# Patient Record
Sex: Male | Born: 1970 | Race: White | Hispanic: No | Smoking: Never smoker
Health system: Southern US, Community
[De-identification: ages and names within clinical notes are randomized; demographics above are authoritative.]

## PROBLEM LIST (undated history)

## (undated) DIAGNOSIS — C801 Malignant (primary) neoplasm, unspecified: Secondary | ICD-10-CM

## (undated) DIAGNOSIS — F419 Anxiety disorder, unspecified: Secondary | ICD-10-CM

## (undated) DIAGNOSIS — N289 Disorder of kidney and ureter, unspecified: Secondary | ICD-10-CM

## (undated) DIAGNOSIS — N200 Calculus of kidney: Secondary | ICD-10-CM

## (undated) DIAGNOSIS — R519 Headache, unspecified: Secondary | ICD-10-CM

## (undated) DIAGNOSIS — C4491 Basal cell carcinoma of skin, unspecified: Secondary | ICD-10-CM

## (undated) DIAGNOSIS — F32A Depression, unspecified: Secondary | ICD-10-CM

## (undated) DIAGNOSIS — Z87442 Personal history of urinary calculi: Secondary | ICD-10-CM

## (undated) HISTORY — DX: Headache, unspecified: R51.9

## (undated) HISTORY — PX: HERNIA REPAIR: SHX51

---

## 1999-07-05 ENCOUNTER — Encounter: Payer: Self-pay | Admitting: Emergency Medicine

## 1999-07-05 ENCOUNTER — Emergency Department (HOSPITAL_COMMUNITY): Admission: EM | Admit: 1999-07-05 | Discharge: 1999-07-05 | Payer: Self-pay | Admitting: Emergency Medicine

## 2001-05-16 ENCOUNTER — Emergency Department (HOSPITAL_COMMUNITY): Admission: EM | Admit: 2001-05-16 | Discharge: 2001-05-16 | Payer: Self-pay | Admitting: Emergency Medicine

## 2004-06-16 ENCOUNTER — Emergency Department (HOSPITAL_COMMUNITY): Admission: EM | Admit: 2004-06-16 | Discharge: 2004-06-16 | Payer: Self-pay | Admitting: Emergency Medicine

## 2004-10-15 ENCOUNTER — Emergency Department: Payer: Self-pay | Admitting: Emergency Medicine

## 2005-02-11 ENCOUNTER — Inpatient Hospital Stay: Payer: Self-pay | Admitting: General Surgery

## 2005-02-11 ENCOUNTER — Other Ambulatory Visit: Payer: Self-pay

## 2005-03-25 IMAGING — CR DG FINGER THUMB 2+V*R*
1 series · 1 of 1 positions shown · non-contrast
Comparison: none

CLINICAL DATA: Injury to right thumb.  Pain.
 RIGHT THUMB 3 VIEWS
 There is no evidence of fracture or dislocation. No other significant bone or soft tissue abnormalities are seen.

 IMPRESSION
 Normal study.

[view not recorded]
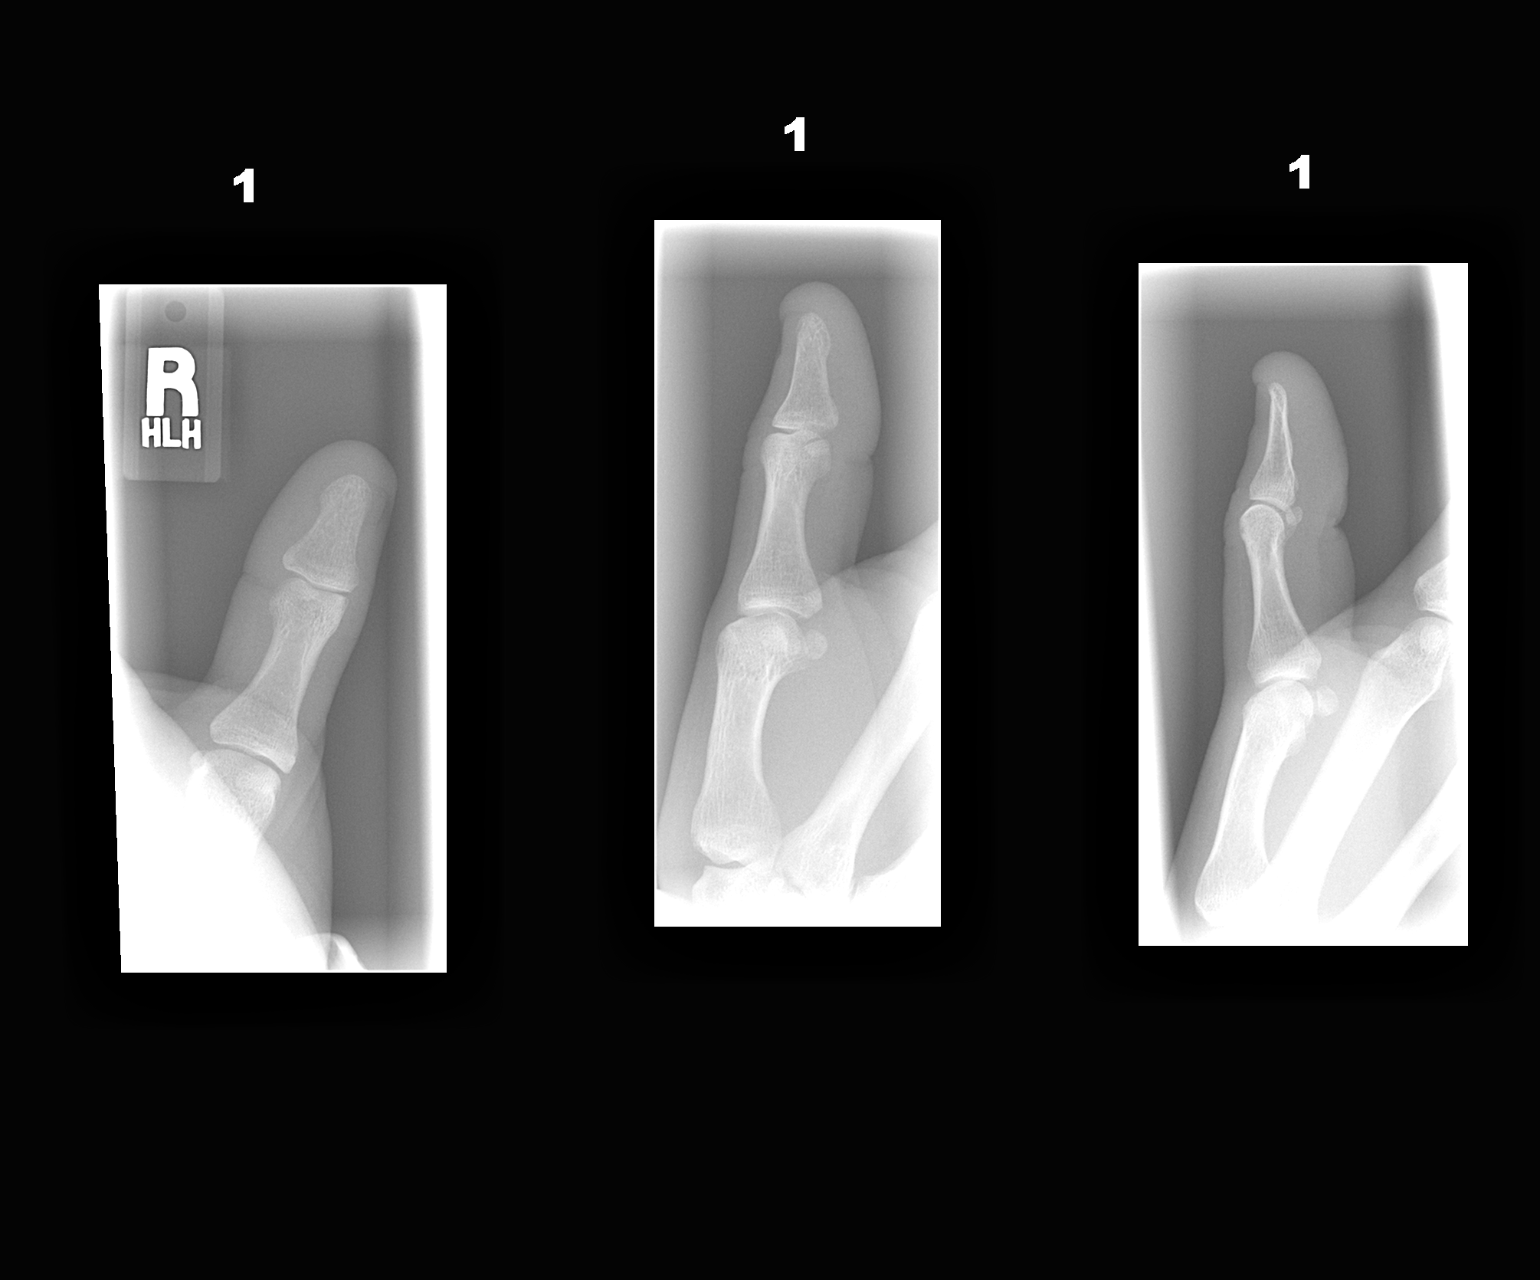

[1 of 1 positions shown; findings below may reference images not displayed]

## 2006-11-14 ENCOUNTER — Emergency Department: Payer: Self-pay | Admitting: Emergency Medicine

## 2007-08-23 IMAGING — CT CT ABD-PELV W/O CM
1 of 2 series · 15 of 32 positions shown, 19 images · non-contrast
Comparison: none

REASON FOR EXAM: ABDOMINAL/FLANK PAIN, HISTORY OF KIDNEY STONES
COMMENTS:

PROCEDURE:     CT  - CT ABDOMEN AND PELVIS W[DATE] [DATE]
RESULT:     Unenhanced, emergent CT scan of abdomen and pelvis is performed
for LEFT flank pain.

[Series 2: soft tissue · axial · 0.69mm/px · z∈[-1484,-1070]mm · 15 of 156 slices shown, 19 images]
[im 12/156  soft-tissue]
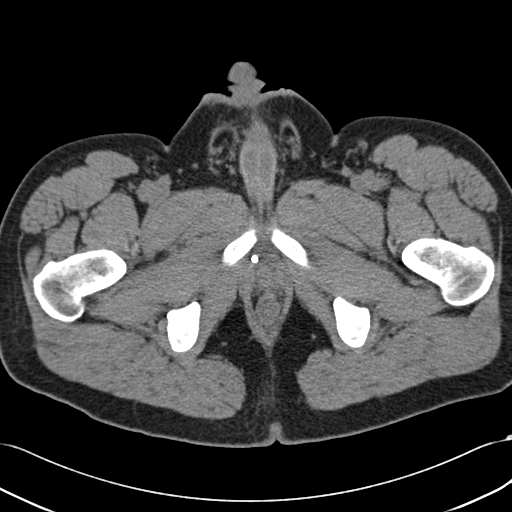
[im 12/156  bone]
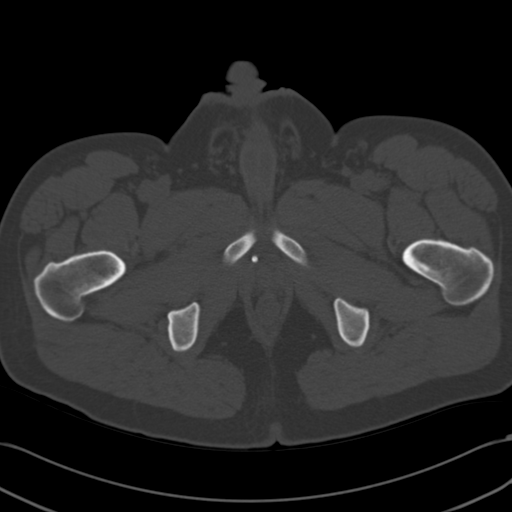
[im 23/156  soft-tissue]
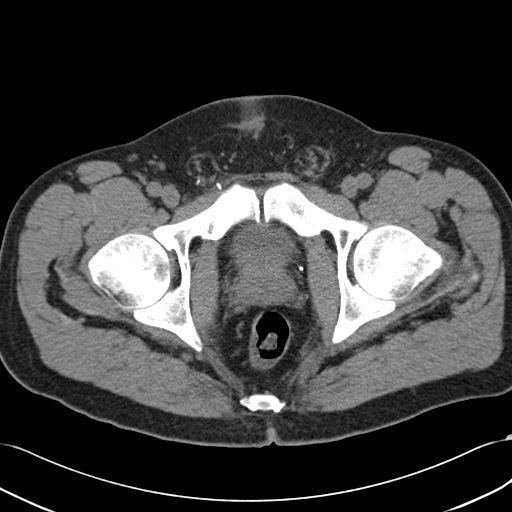
[im 35/156  soft-tissue]
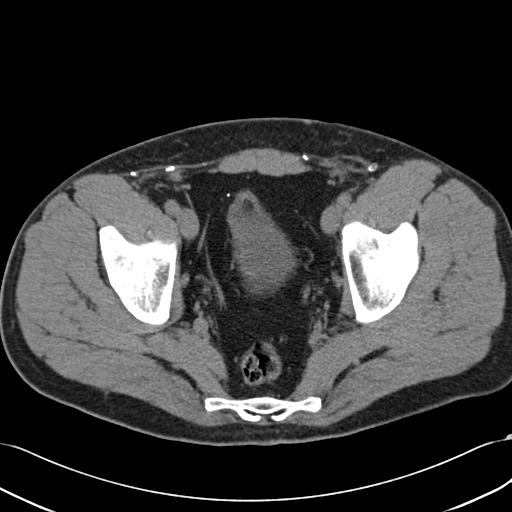
[im 46/156  soft-tissue]
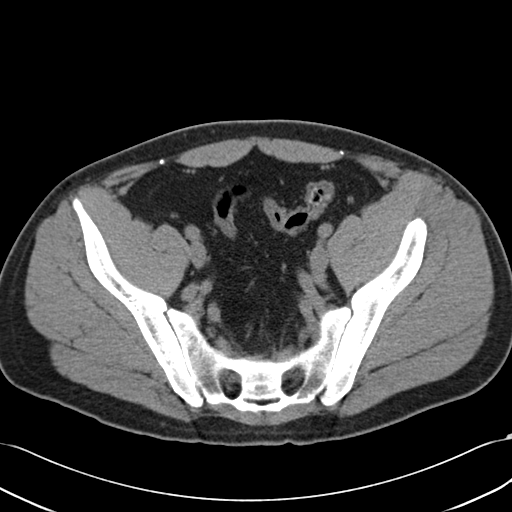
[im 58/156  soft-tissue]
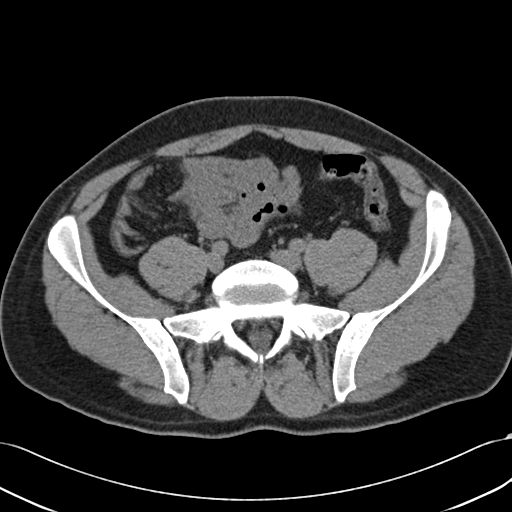
[im 69/156  soft-tissue]
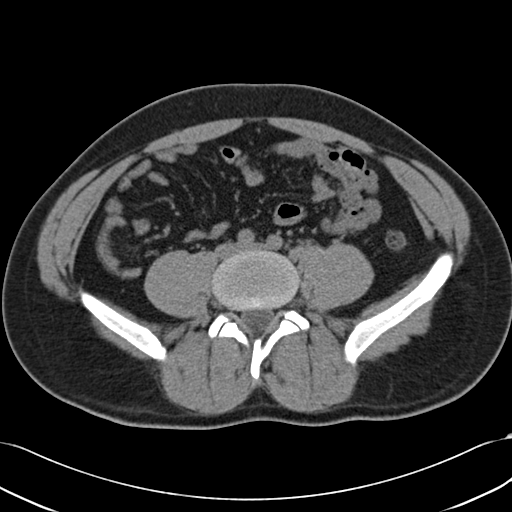
[im 81/156  soft-tissue]
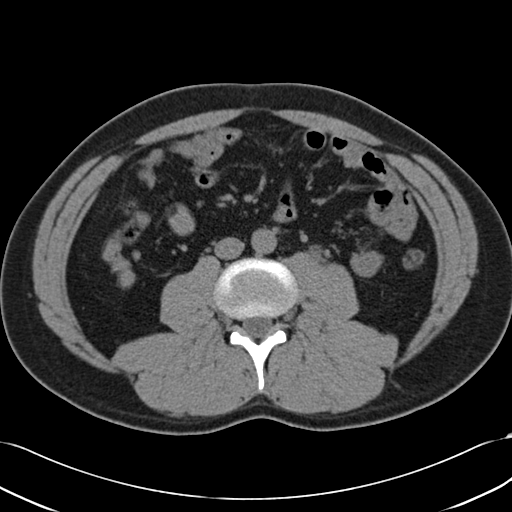
[im 92/156  soft-tissue]
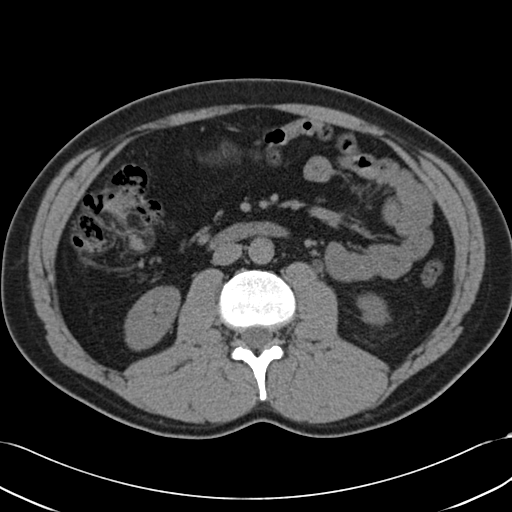
[im 104/156  soft-tissue]
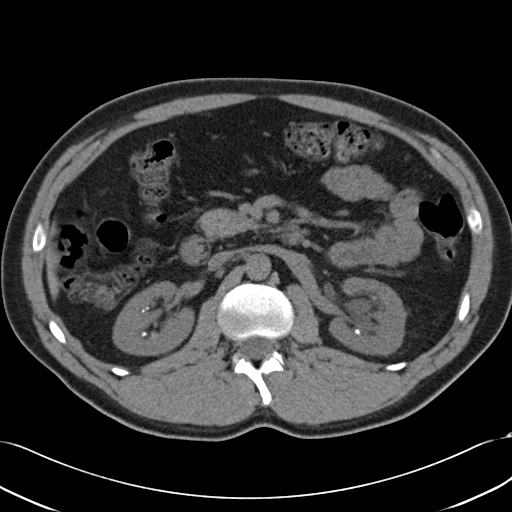
[im 104/156  bone]
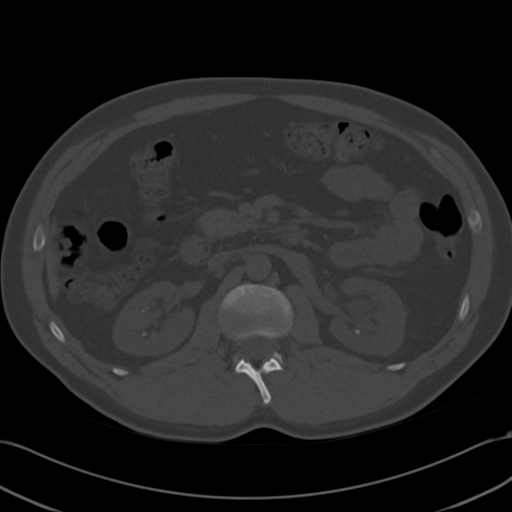
[im 115/156  soft-tissue]
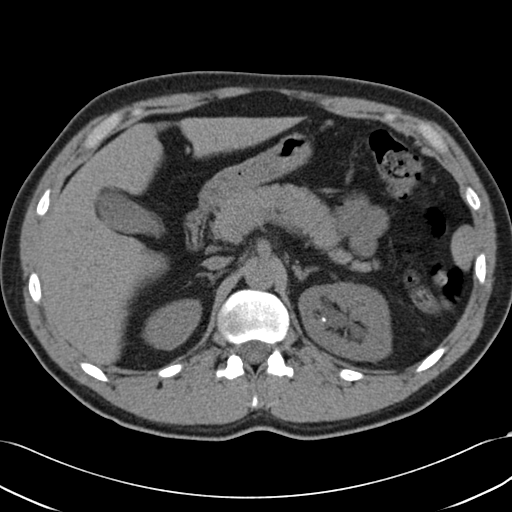
[im 127/156  soft-tissue]
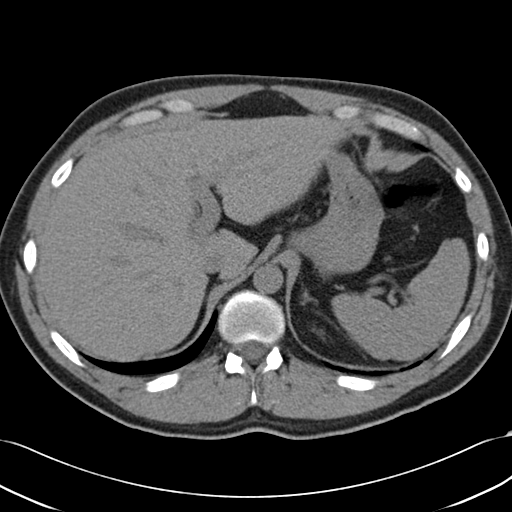
[im 133/156  lung]
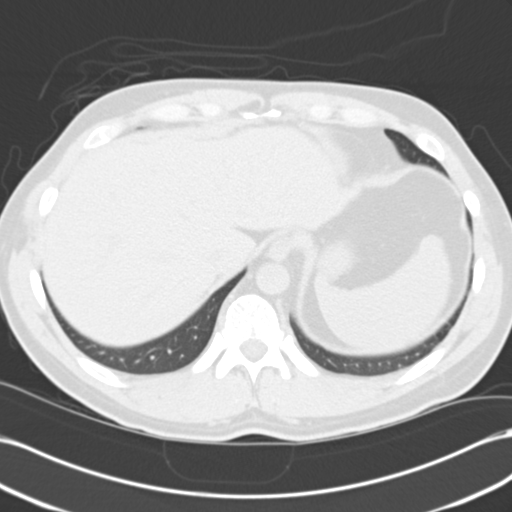
[im 138/156  soft-tissue]
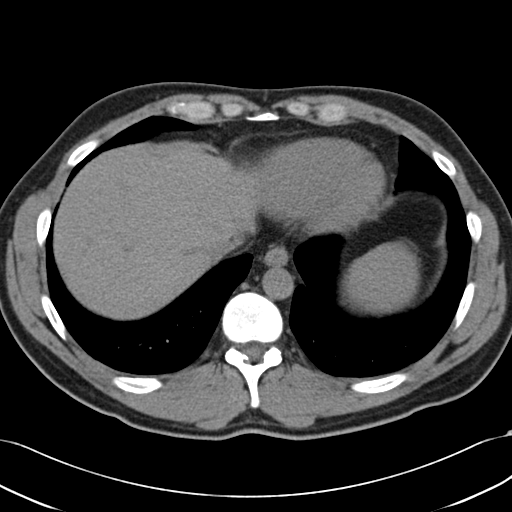
[im 138/156  lung]
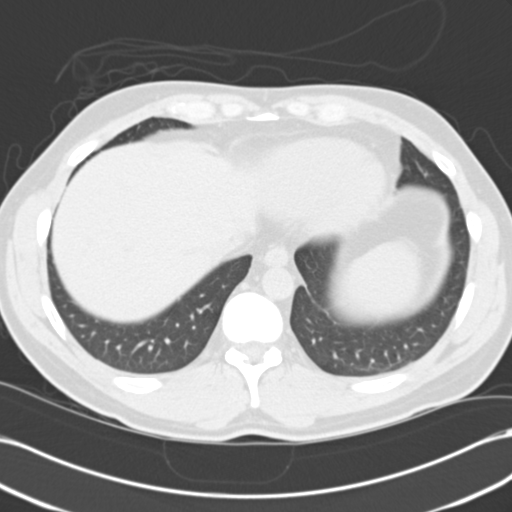
[im 144/156  lung]
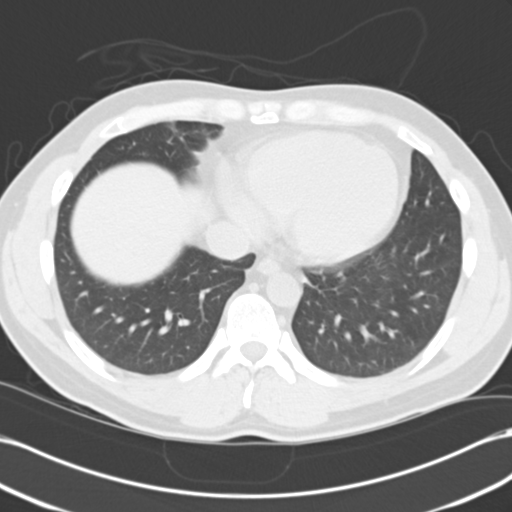
[im 150/156  soft-tissue]
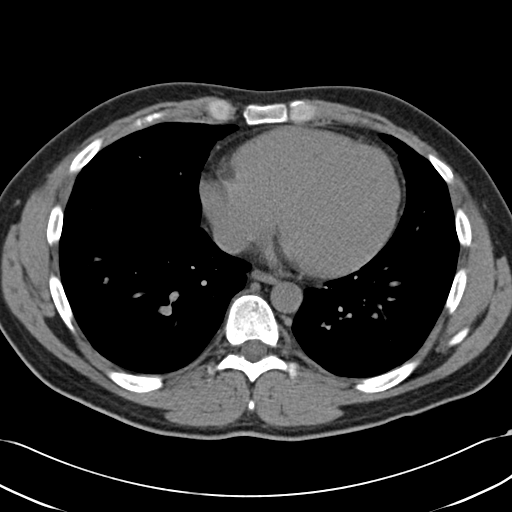
[im 150/156  lung]
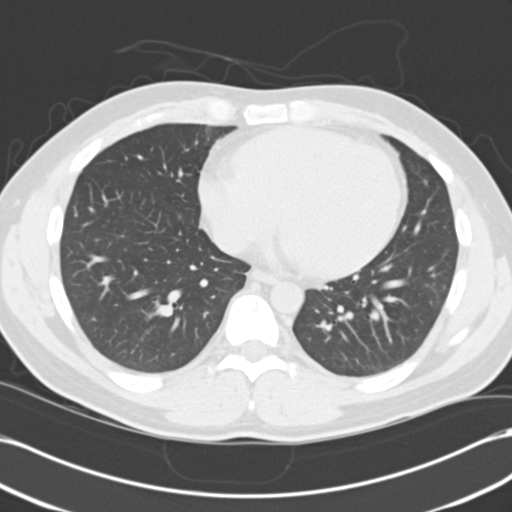

[15 of 32 positions shown; findings below may reference images not displayed]

FINDINGS: There is an approximately 5.0 mm to 6.0 mm calculus within the
proximal LEFT ureter at the level of the iliac crest. There is noted mild to
moderate hydronephrosis on the LEFT. There are multiple calculi in both
kidneys ranging from 2.0 to 5.0 mm. No hydronephrosis is noted on the RIGHT.
Multiple pelvic phleboliths are identified. There is no ascites,
appendicitis or diverticulitis identified. No major organ abnormalities are
noted on this noncontrast study.

The images through the lung bases reveal the lung bases to be clear with no
effusions.
IMPRESSION: 1.     Bilateral renal calculi.
2.     There is noted a 5.0 to 6.0 mm calculus in the mid LEFT ureter with
proximal mild to moderate hydronephrosis.

The report was called to the Emergency Room at the conclusion of dictation.

## 2008-05-19 ENCOUNTER — Emergency Department: Payer: Self-pay | Admitting: Emergency Medicine

## 2009-02-25 IMAGING — CR DG CHEST 2V
1 series · 2 of 2 positions shown · non-contrast
Comparison: none

REASON FOR EXAM: cough
COMMENTS:

PROCEDURE:     DXR - DXR CHEST PA (OR AP) AND LATERAL  - May 19, 2008  [DATE]
RESULT:     The lung fields are clear. The heart, mediastinal and osseous
structures show no acute changes.

[Series 1: view not recorded · 0.17mm/px · 2 of 2 slices shown]
[im 1/2]
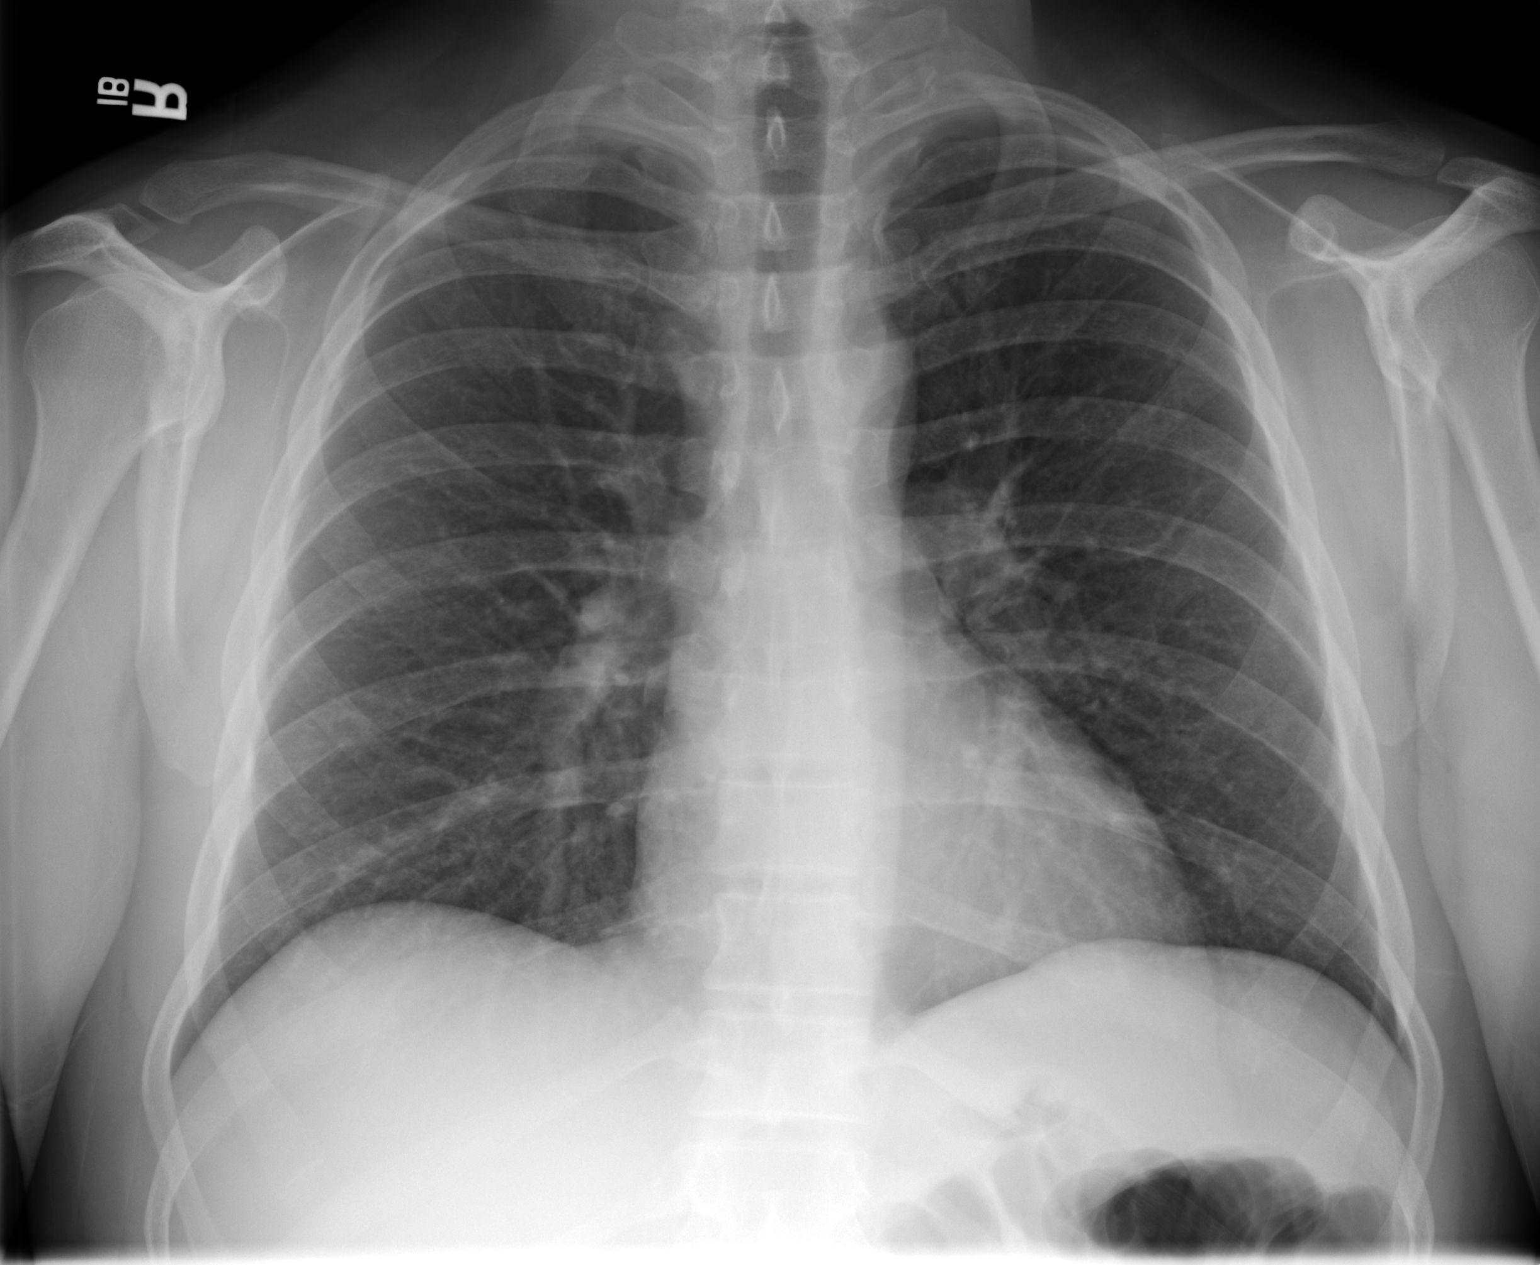
[im 2/2]
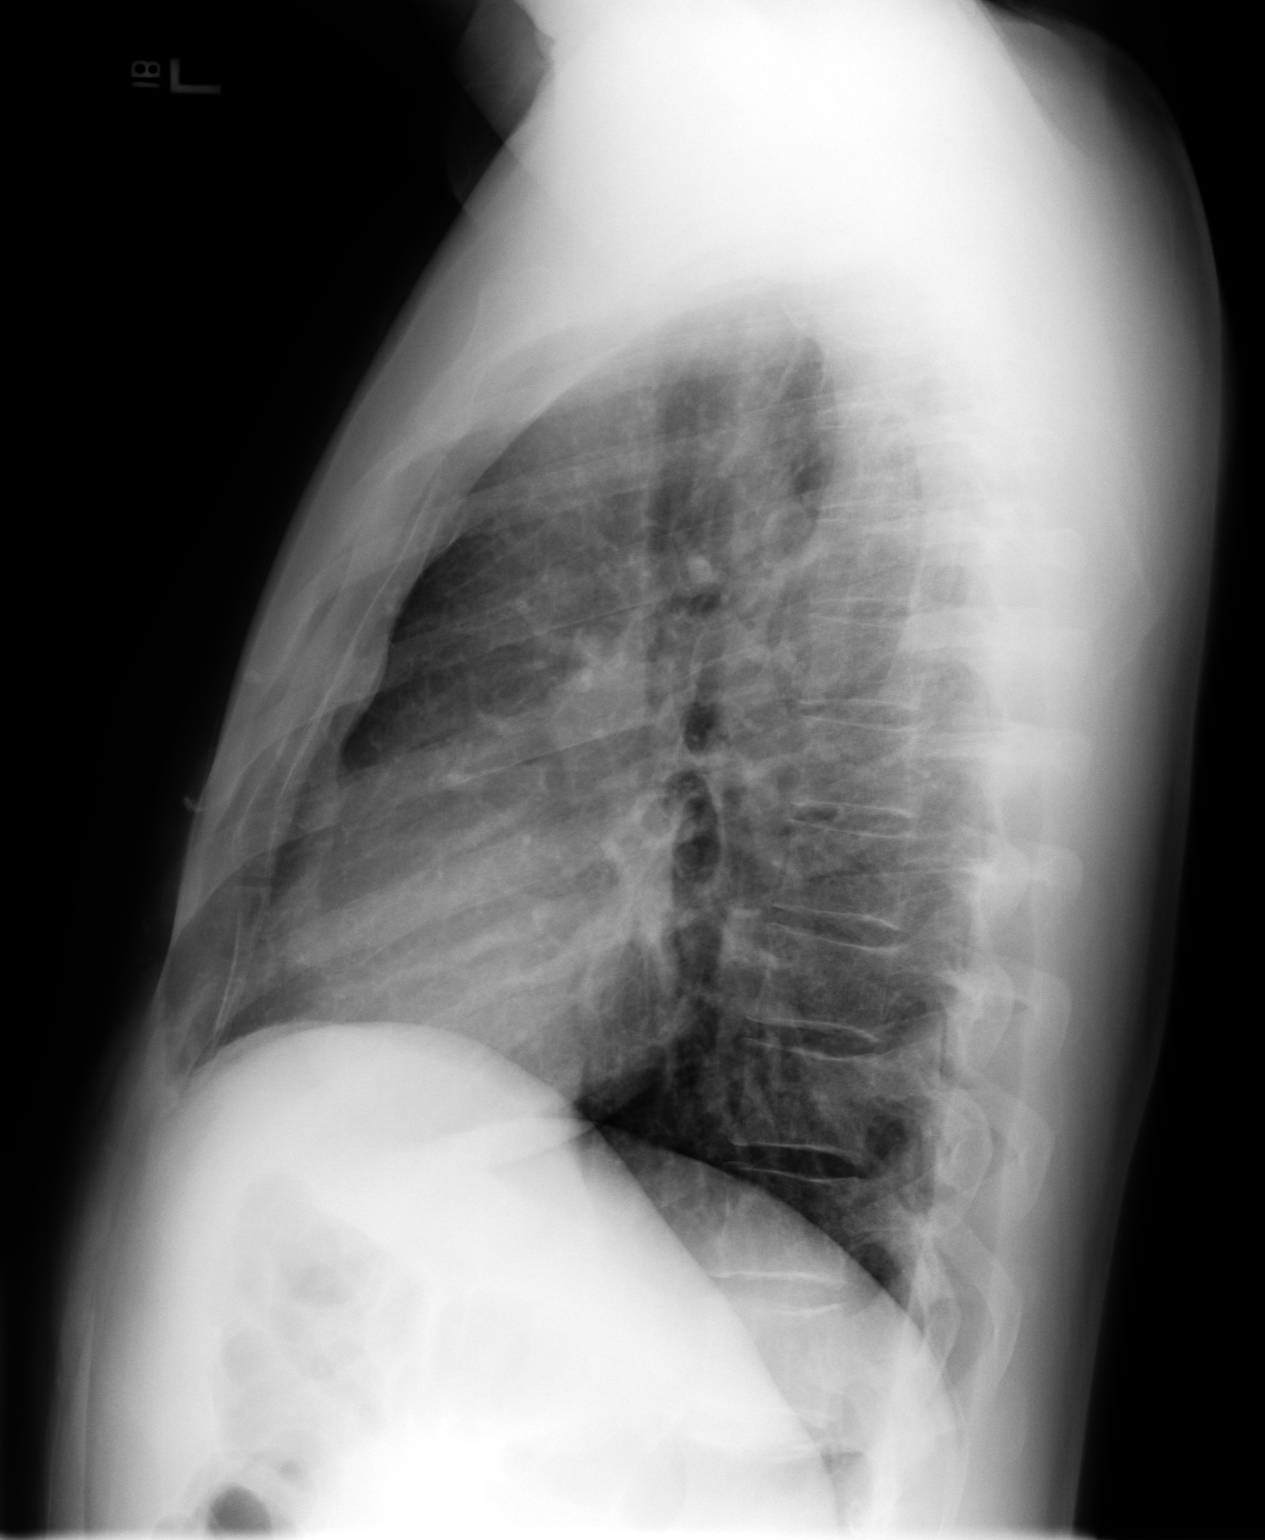

[2 of 2 positions shown; findings below may reference images not displayed]

IMPRESSION: 1.     No acute changes are identified.

## 2009-04-29 ENCOUNTER — Emergency Department: Payer: Self-pay | Admitting: Internal Medicine

## 2011-03-18 ENCOUNTER — Inpatient Hospital Stay (INDEPENDENT_AMBULATORY_CARE_PROVIDER_SITE_OTHER)
Admission: RE | Admit: 2011-03-18 | Discharge: 2011-03-18 | Disposition: A | Discharge status: Home / Self Care | Payer: BC Managed Care – PPO | Source: Ambulatory Visit | Attending: Family Medicine | Admitting: Family Medicine

## 2011-03-18 DIAGNOSIS — M25529 Pain in unspecified elbow: Secondary | ICD-10-CM

## 2011-03-18 DIAGNOSIS — J029 Acute pharyngitis, unspecified: Secondary | ICD-10-CM

## 2011-03-18 DIAGNOSIS — M799 Soft tissue disorder, unspecified: Secondary | ICD-10-CM

## 2011-03-18 LAB — POCT RAPID STREP A (OFFICE): Streptococcus, Group A Screen (Direct): NEGATIVE

## 2011-05-17 ENCOUNTER — Inpatient Hospital Stay (INDEPENDENT_AMBULATORY_CARE_PROVIDER_SITE_OTHER)
Admission: RE | Admit: 2011-05-17 | Discharge: 2011-05-17 | Disposition: A | Discharge status: Home / Self Care | Payer: BC Managed Care – PPO | Source: Ambulatory Visit | Attending: Family Medicine | Admitting: Family Medicine

## 2011-05-17 DIAGNOSIS — L559 Sunburn, unspecified: Secondary | ICD-10-CM

## 2013-05-04 ENCOUNTER — Emergency Department: Payer: Self-pay | Admitting: Internal Medicine

## 2013-05-04 LAB — COMPREHENSIVE METABOLIC PANEL
Albumin: 4.1 g/dL (ref 3.4–5.0)
Alkaline Phosphatase: 78 U/L (ref 50–136)
Anion Gap: 5 — ABNORMAL LOW (ref 7–16)
BUN: 12 mg/dL (ref 7–18)
Bilirubin,Total: 0.5 mg/dL (ref 0.2–1.0)
Calcium, Total: 8.8 mg/dL (ref 8.5–10.1)
Chloride: 106 mmol/L (ref 98–107)
Co2: 27 mmol/L (ref 21–32)
Creatinine: 1.33 mg/dL — ABNORMAL HIGH (ref 0.60–1.30)
EGFR (African American): >60
EGFR (Non-African Amer.): >60
Glucose: 119 mg/dL — ABNORMAL HIGH (ref 65–99)
Osmolality: 277 (ref 275–301)
Potassium: 4 mmol/L (ref 3.5–5.1)
SGOT(AST): 20 U/L (ref 15–37)
SGPT (ALT): 22 U/L (ref 12–78)
Sodium: 138 mmol/L (ref 136–145)
Total Protein: 7.7 g/dL (ref 6.4–8.2)

## 2013-05-04 LAB — URINALYSIS, COMPLETE
Bacteria: NONE SEEN
Bilirubin,UR: NEGATIVE
Glucose,UR: NEGATIVE mg/dL (ref 0–75)
Ketone: NEGATIVE
Leukocyte Esterase: NEGATIVE
Nitrite: NEGATIVE
Ph: 6 (ref 4.5–8.0)
Protein: 100
RBC,UR: 2606 /HPF (ref 0–5)
Specific Gravity: 1.02 (ref 1.003–1.030)
Squamous Epithelial: NONE SEEN
WBC UR: 2 /HPF (ref 0–5)

## 2013-05-04 LAB — CBC
HCT: 45.2 % (ref 40.0–52.0)
HGB: 15.2 g/dL (ref 13.0–18.0)
MCH: 29.2 pg (ref 26.0–34.0)
MCHC: 33.6 g/dL (ref 32.0–36.0)
MCV: 87 fL (ref 80–100)
Platelet: 219 10*3/uL (ref 150–440)
RBC: 5.2 10*6/uL (ref 4.40–5.90)
RDW: 13.8 % (ref 11.5–14.5)
WBC: 14.1 10*3/uL — ABNORMAL HIGH (ref 3.8–10.6)

## 2013-11-18 ENCOUNTER — Emergency Department: Payer: Self-pay | Admitting: Emergency Medicine

## 2014-02-10 IMAGING — CT CT STONE STUDY
2 series · 12 of 16 positions shown, 15 images · non-contrast
Comparison: none

REASON FOR EXAM: flank pain r
COMMENTS:

[Series 2: soft tissue · axial · 0.74mm/px · z∈[-1470,-1080]mm · 10 of 160 slices shown, 13 images]
[im 15/160  soft-tissue]
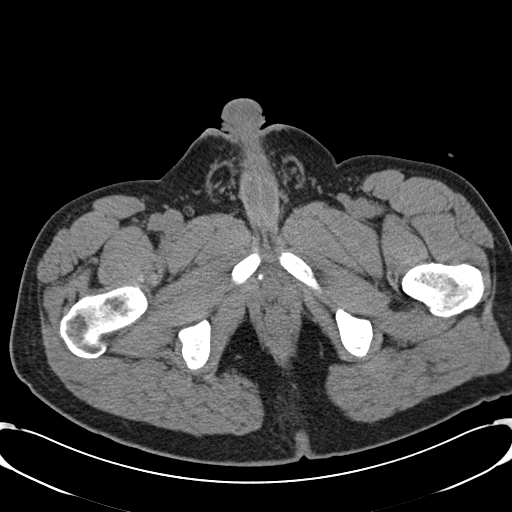
[im 15/160  bone]
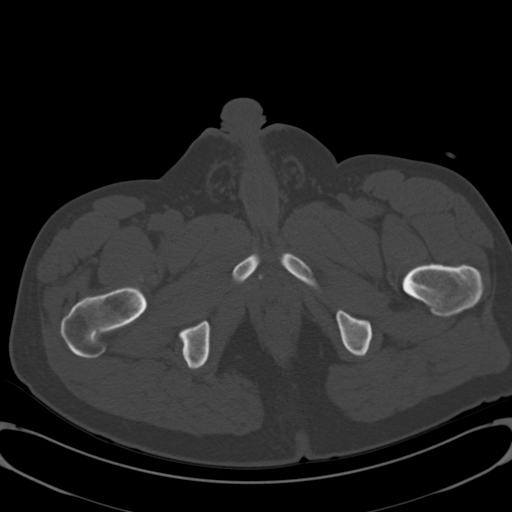
[im 29/160  soft-tissue]
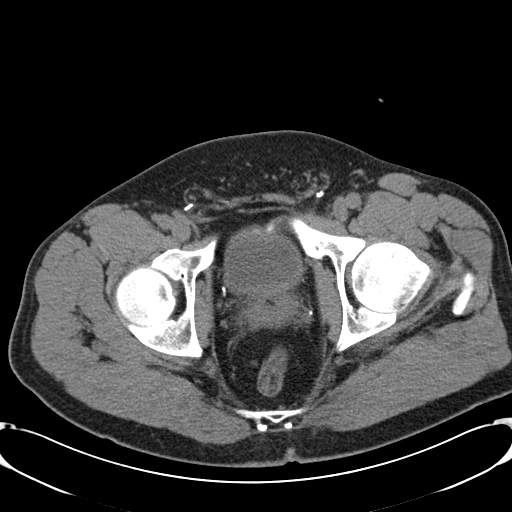
[im 44/160  soft-tissue]
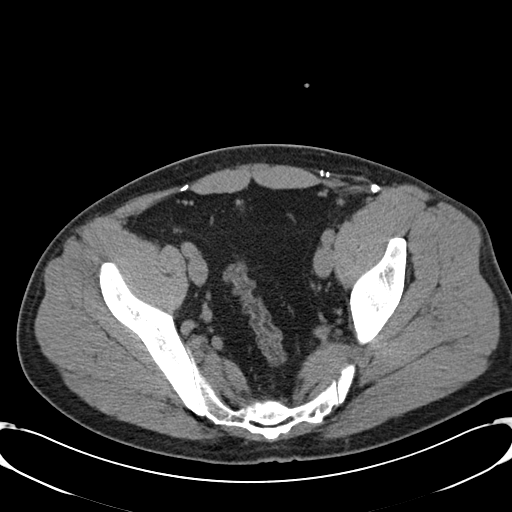
[im 58/160  soft-tissue]
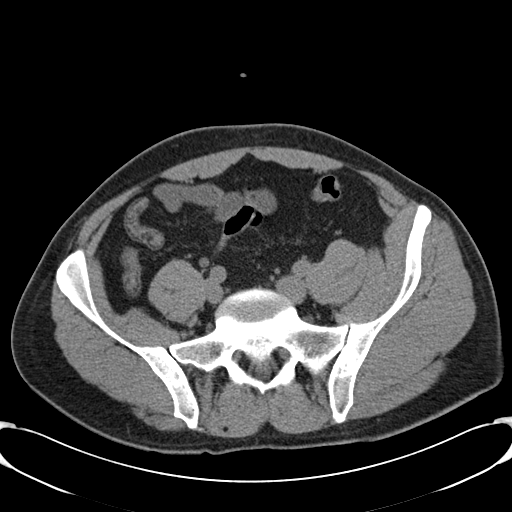
[im 73/160  soft-tissue]
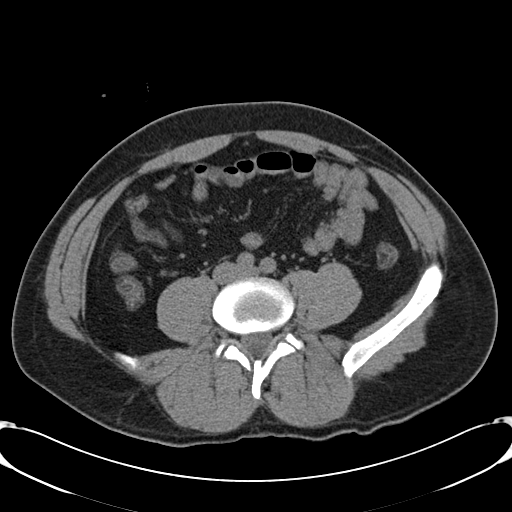
[im 73/160  bone]
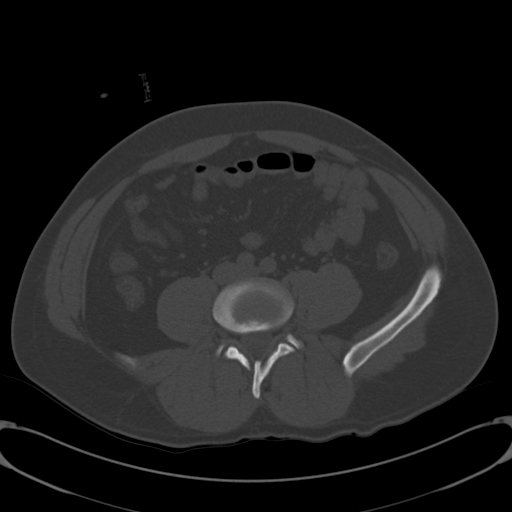
[im 87/160  soft-tissue]
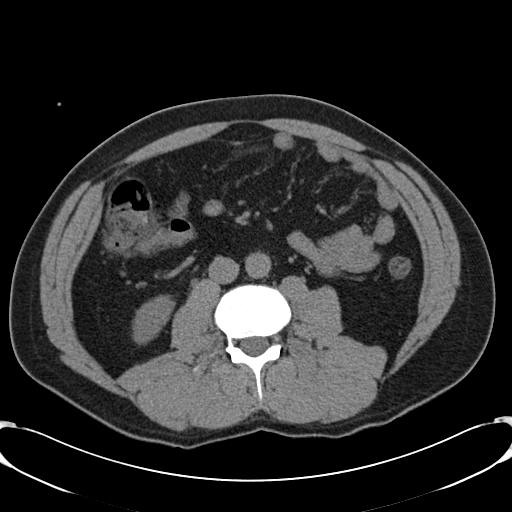
[im 102/160  soft-tissue]
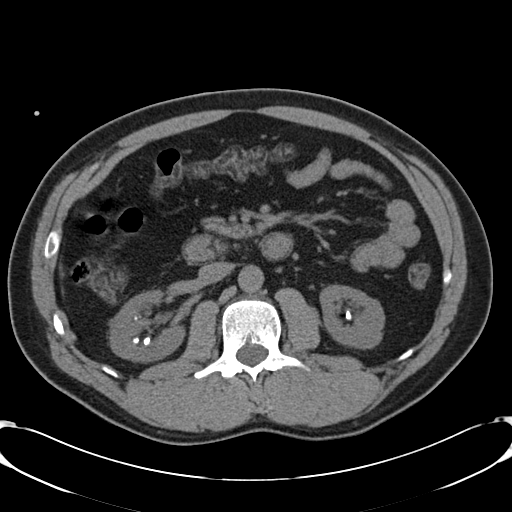
[im 116/160  soft-tissue]
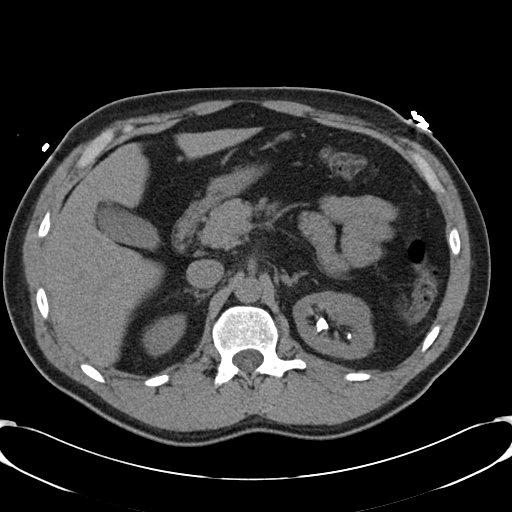
[im 131/160  soft-tissue]
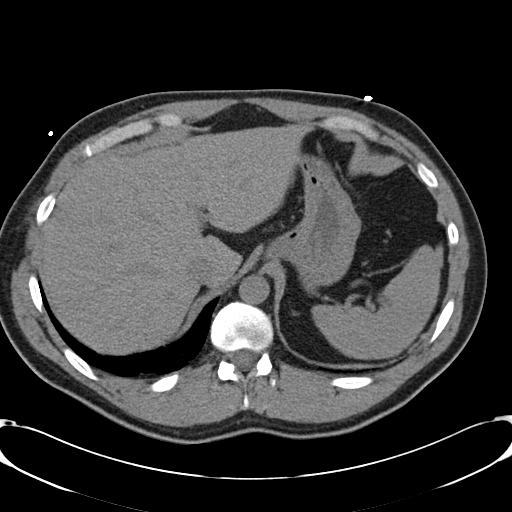
[im 131/160  bone]
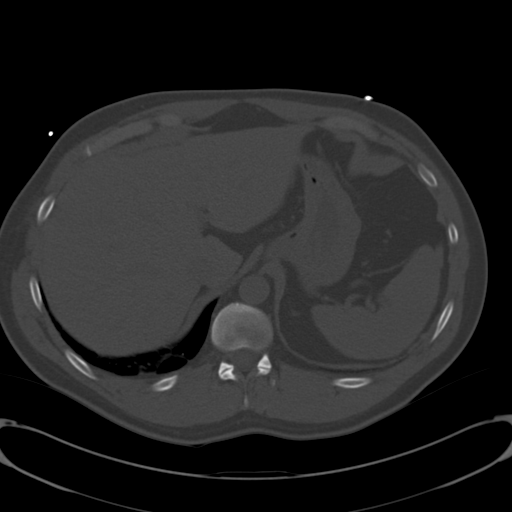
[im 145/160  soft-tissue]
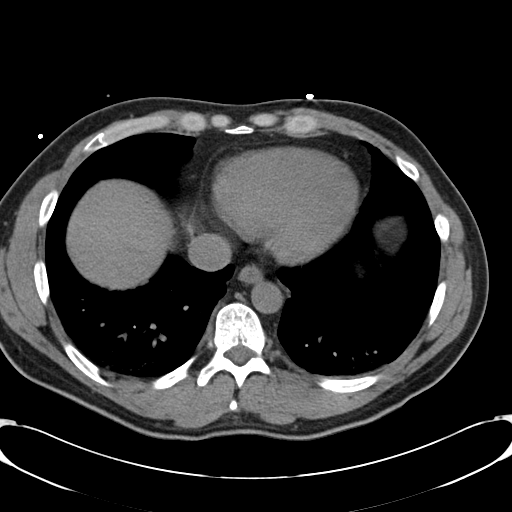

[Series 4: lung · axial · 0.74mm/px · z∈[-1136,-1086]mm · 2 of 52 slices shown]
[im 18/52  bone]
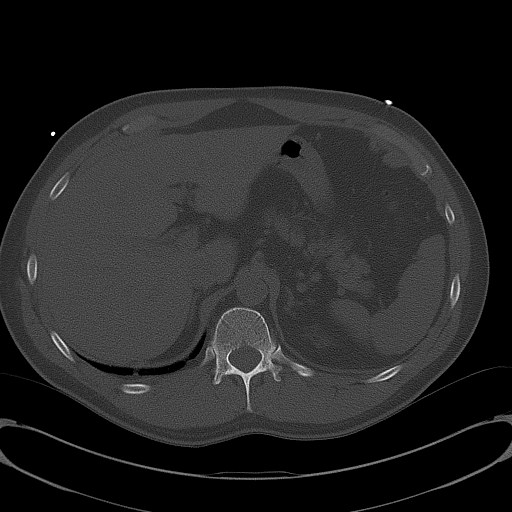
[im 35/52  bone]
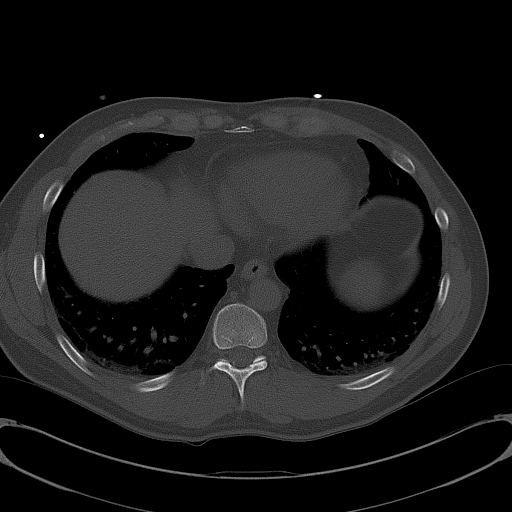

[12 of 16 positions shown; findings below may reference images not displayed]

PROCEDURE:     CT  - CT ABDOMEN /PELVIS WO (STONE)  - May 04, 2013  [DATE]

RESULT:     Axial noncontrast CT scanning was performed through the abdomen
and pelvis with reconstructions at 3 mm intervals and slice thicknesses.

There are multiple stones in both kidneys. There is no hydronephrosis. There
is a 2 mm diameter stone in the proximal right ureter demonstrated on image
83. The partially distended urinary bladder is normal in appearance. There
are numerous phleboliths within the pelvis.

The liver exhibits no focal mass or ductal dilation. The gallbladder,
pancreas, spleen, nondistended stomach, and adrenal glands are normal in
appearance. The caliber of the abdominal aorta is normal. There is no
periaortic nor pericaval lymphadenopathy. The unopacified loops of small and
large bowel exhibit no evidence of ileus nor of obstruction. There are
scattered sigmoid diverticula without evidence of acute diverticulitis. The
appendix is not discretely demonstrated.

The patient has undergone previous bilateral inguinal hernia repair. Some
breakdown at the repair site is suspected.

The lung bases exhibit compressive atelectasis bilaterally. The cardiac
chambers are top normal to mildly enlarged. The lumbar vertebral bodies are
preserved in height.
IMPRESSION: 1. There is a 2 mm diameter proximal right ureteral stone. This is resulting
in very minimal hydronephrosis. There are multiple stones in both kidneys.
There is no evidence of obstruction on the left. No bladder stone is
demonstrated.
2. There is no acute hepatobiliary abnormality nor acute bowel abnormality.
3. There is very mild compressive atelectasis posteriorly in both lung
bases.

[REDACTED]

## 2014-08-27 IMAGING — CR DG ELBOW COMPLETE 3+V*L*
1 series · 4 of 4 positions shown · non-contrast
Comparison: None.

CLINICAL DATA: Pain and swelling

EXAM:
LEFT ELBOW - COMPLETE 3+ VIEW

[Series 1: ap · 0.17mm/px · 4 of 4 slices shown]
[im 1/4]
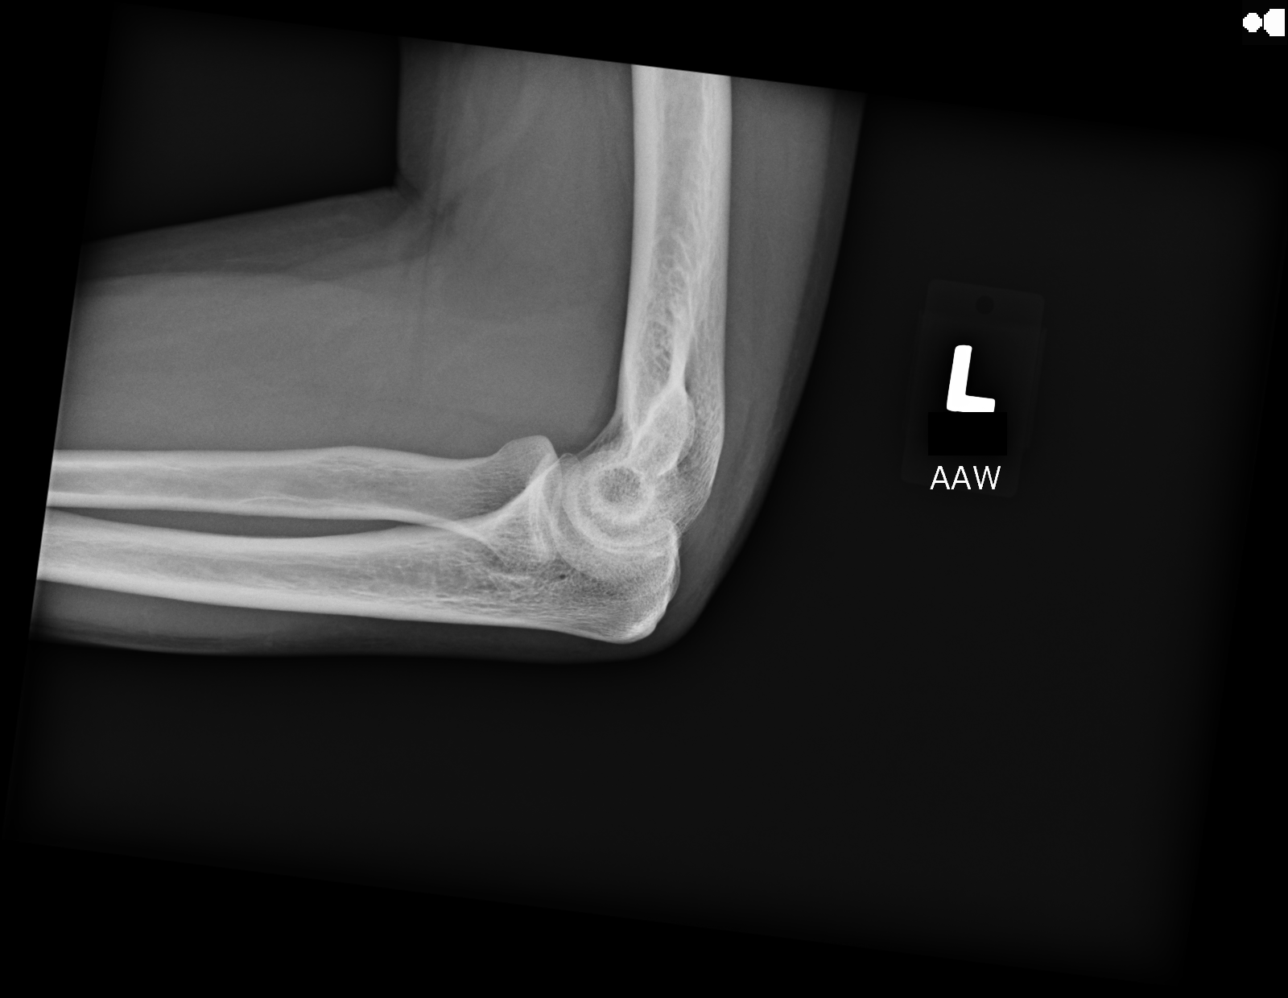
[im 2/4]
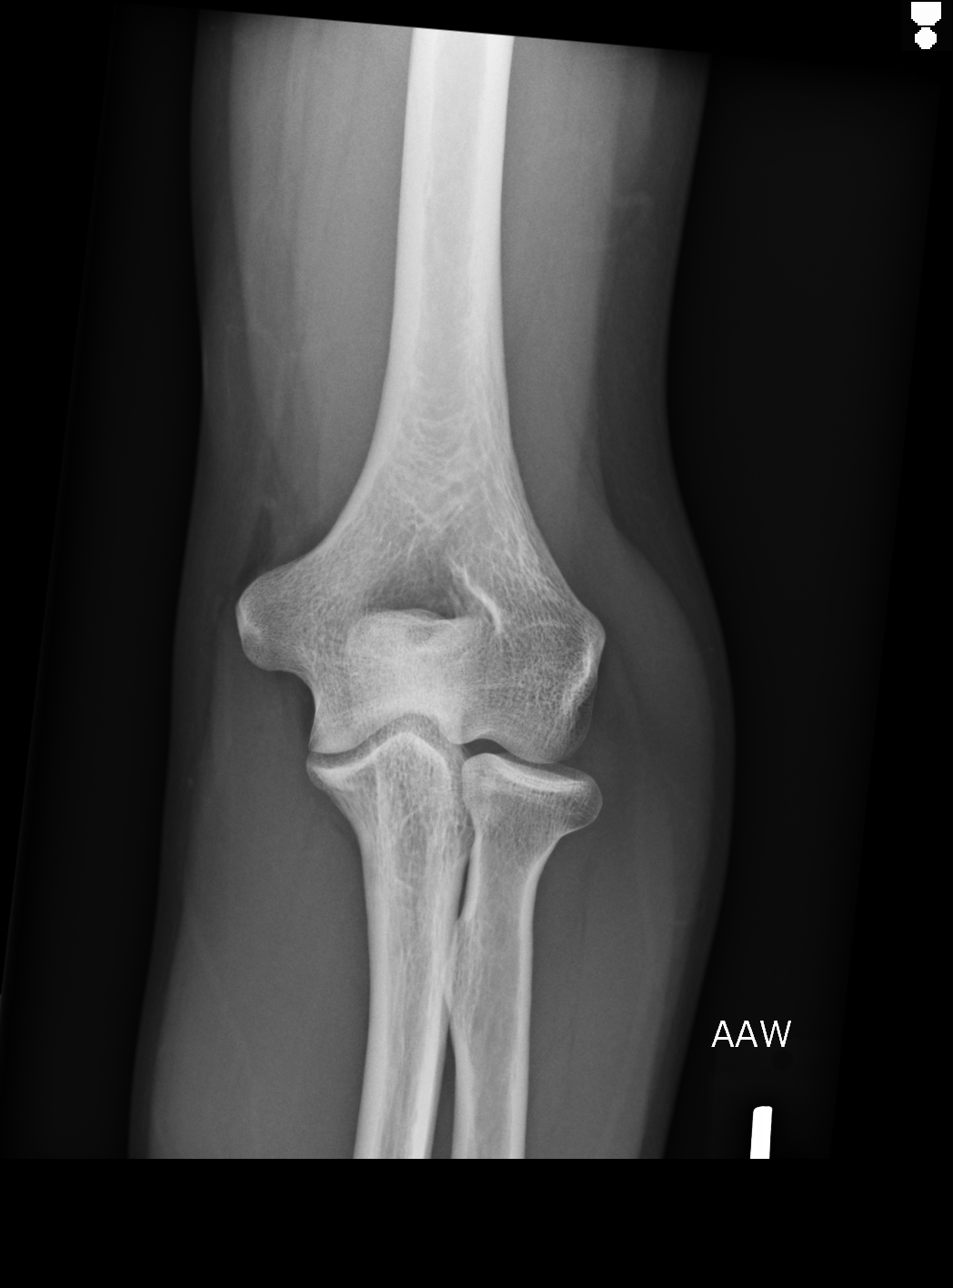
[im 3/4]
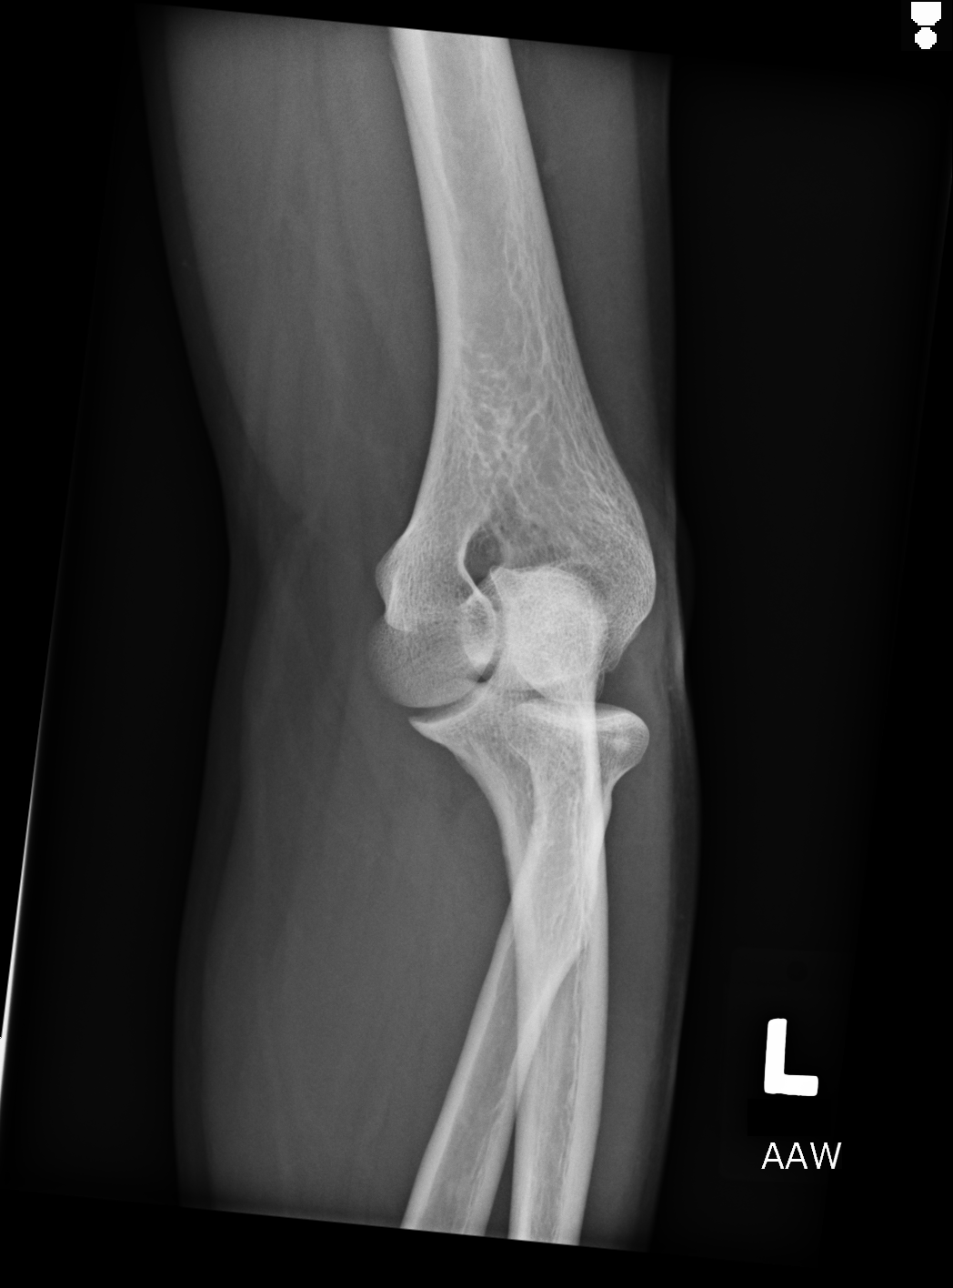
[im 4/4]
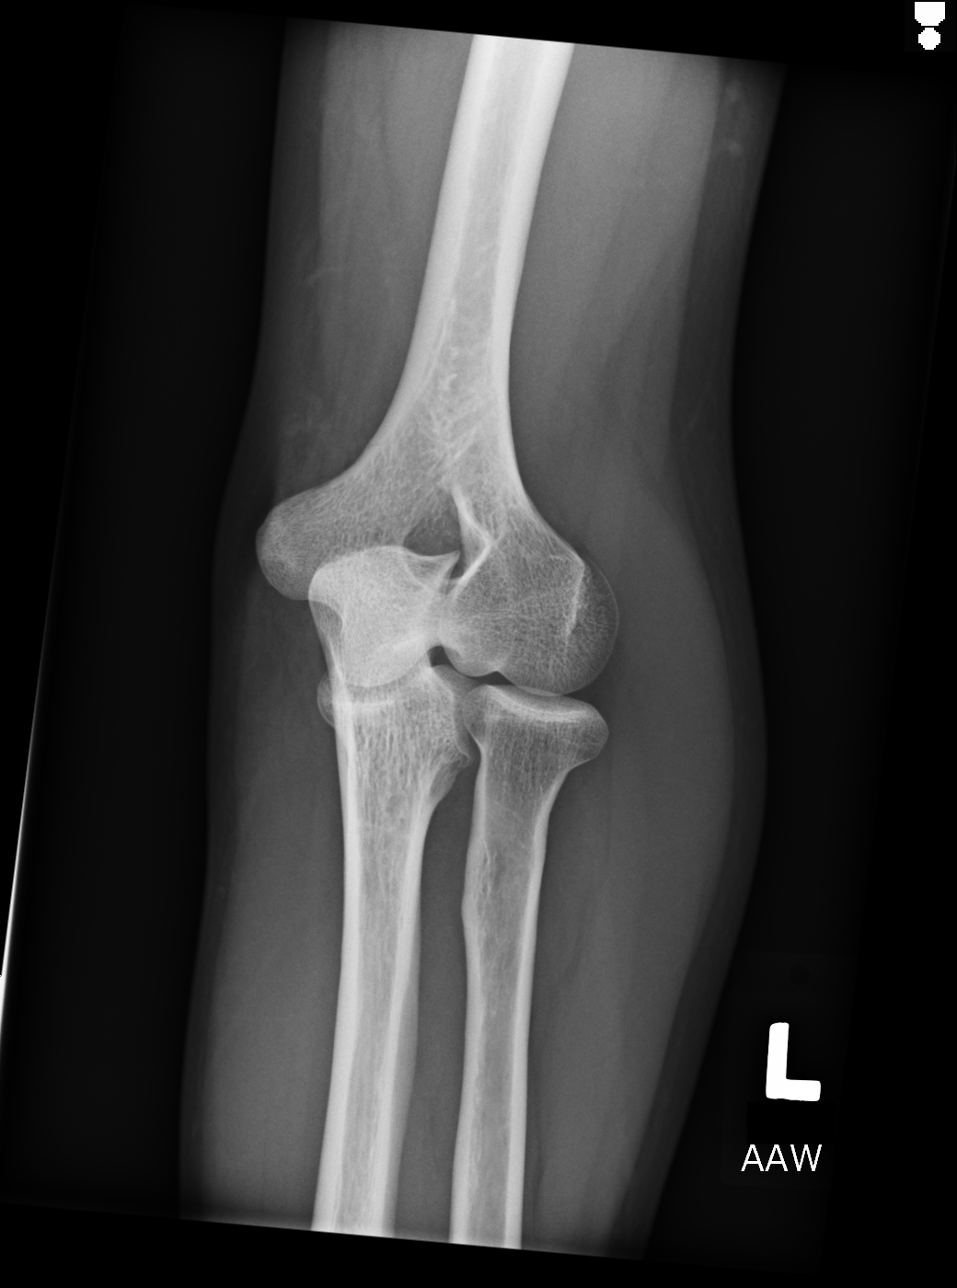

[4 of 4 positions shown; findings below may reference images not displayed]

FINDINGS: Four views of left elbow submitted. No acute fracture or
subluxation. No posterior fat pad sign.
IMPRESSION: Negative.

## 2014-08-27 IMAGING — CR DG FOOT COMPLETE 3+V*L*
1 series · 3 of 3 positions shown · non-contrast
Comparison: None.

CLINICAL DATA: Status post fall with pain around the 1st and 2nd
metatarsals

EXAM:
LEFT FOOT - COMPLETE 3+ VIEW

[Series 1: ap · 0.17mm/px · 3 of 3 slices shown]
[im 1/3]
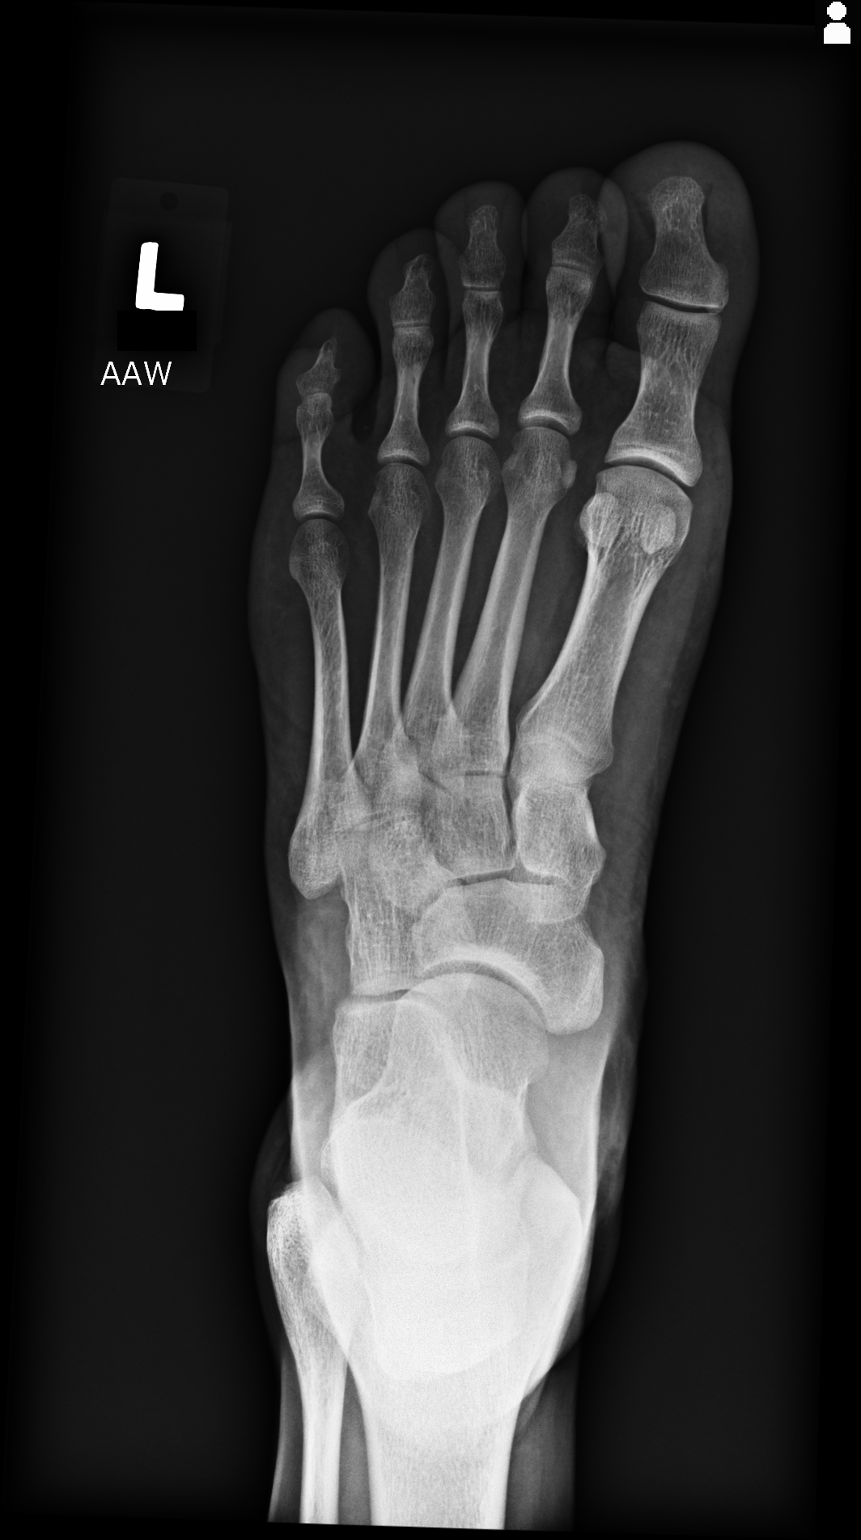
[im 2/3]
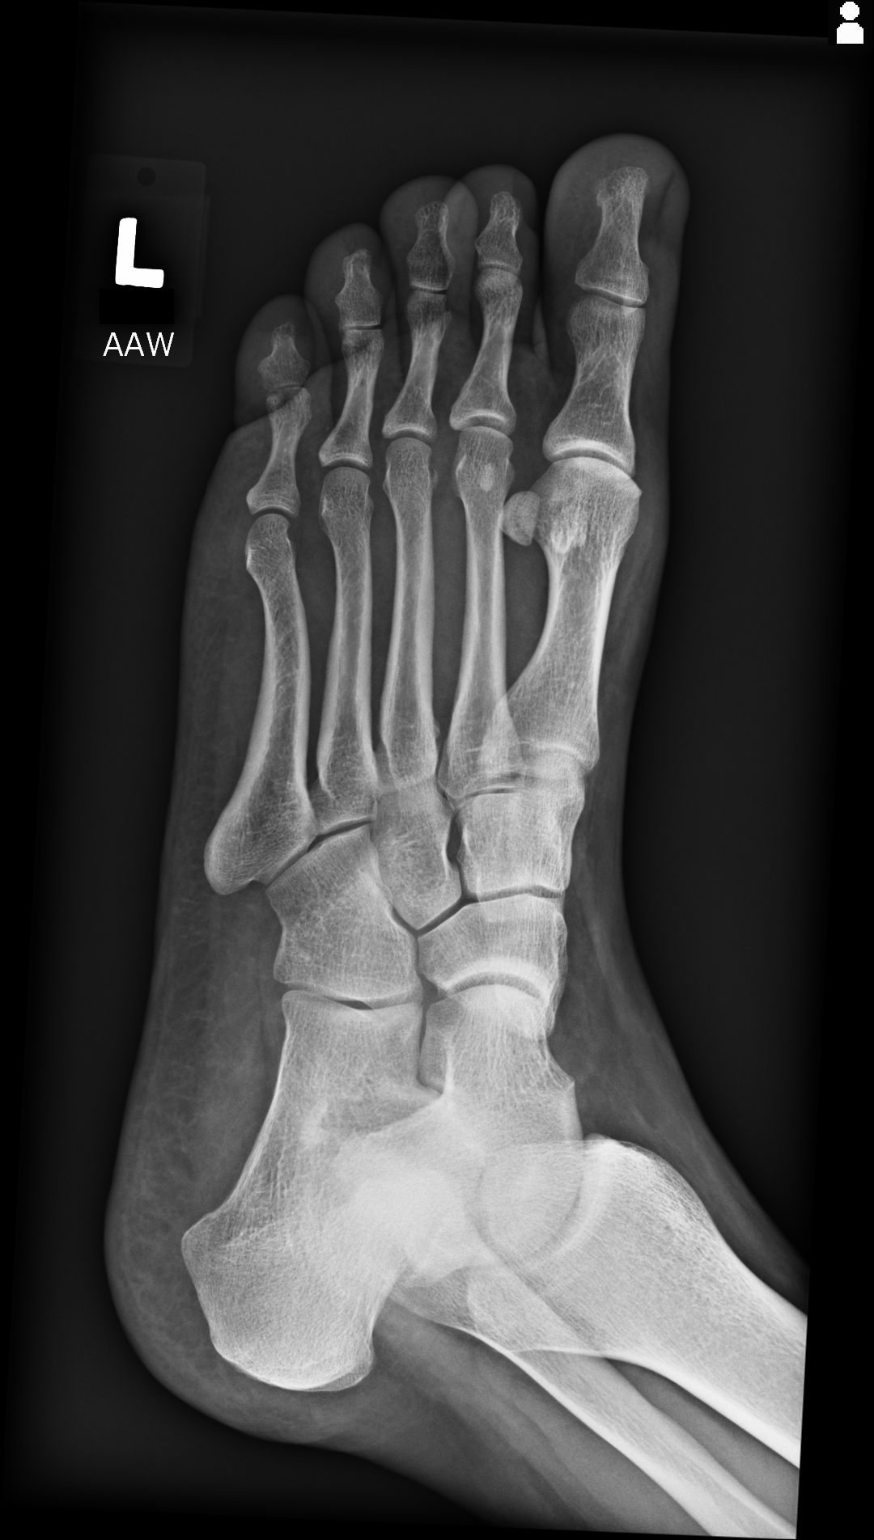
[im 3/3]
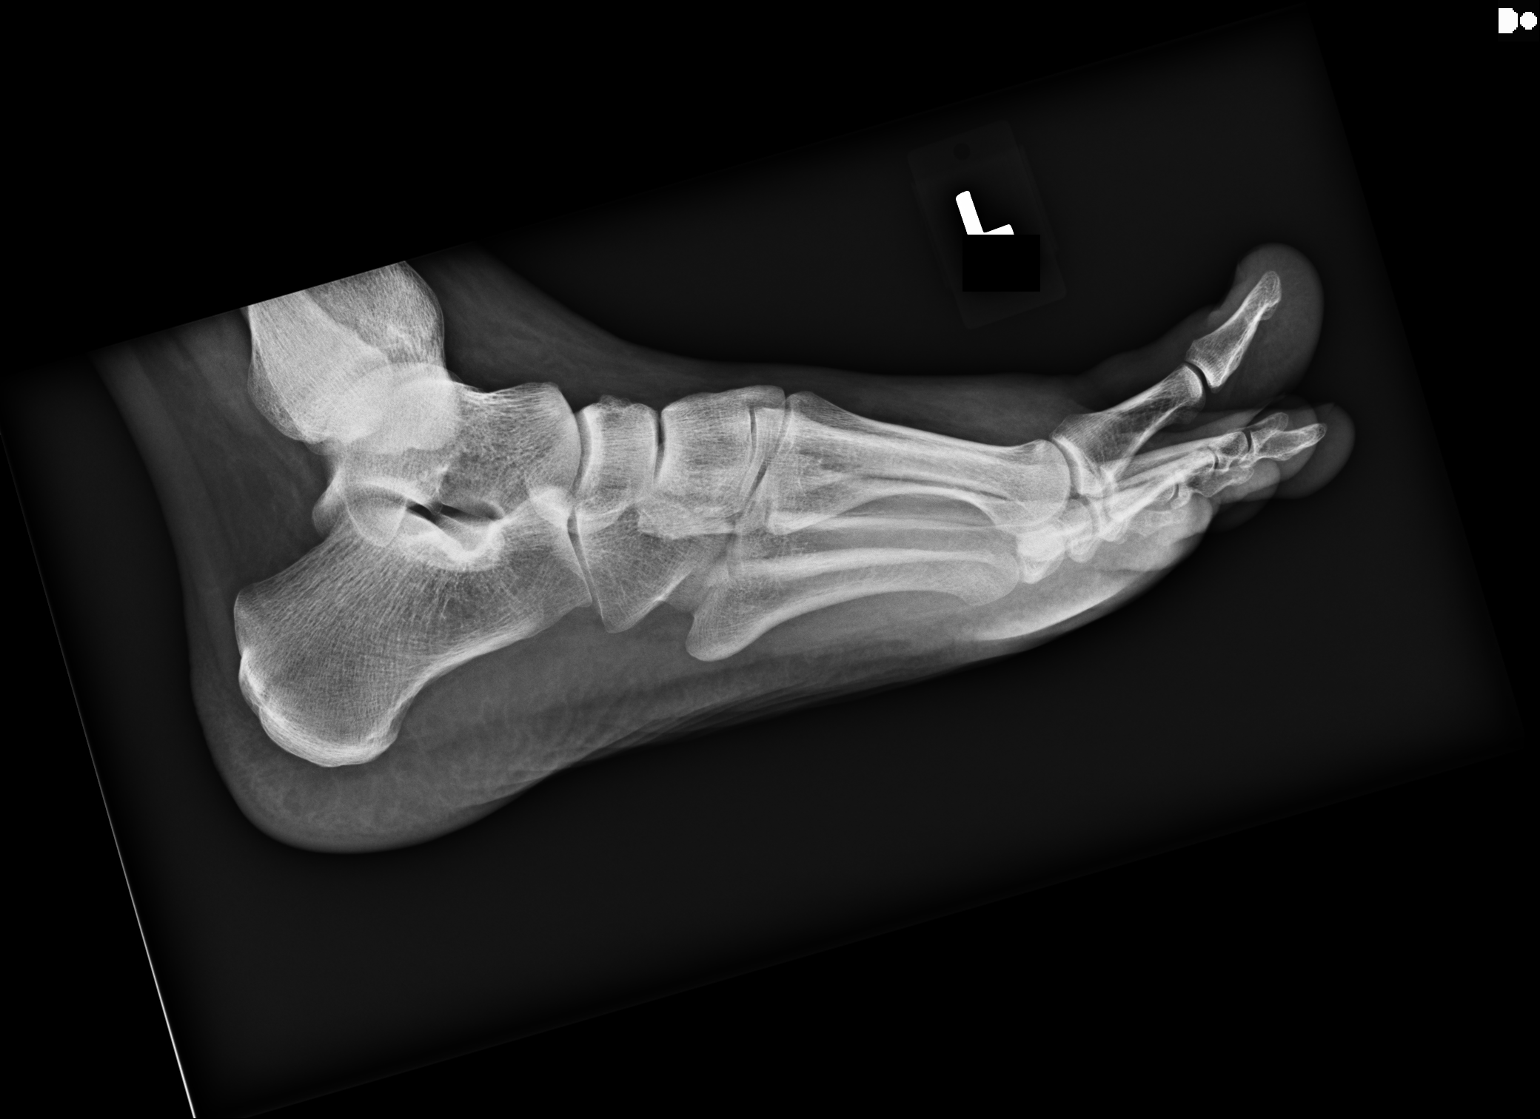

[3 of 3 positions shown; findings below may reference images not displayed]

FINDINGS: There is no evidence of fracture or dislocation. There is no
evidence of arthropathy or other focal bone abnormality. Soft
tissues are unremarkable.
IMPRESSION: Negative.

## 2014-09-09 ENCOUNTER — Emergency Department (HOSPITAL_COMMUNITY)
Admission: EM | Admit: 2014-09-09 | Discharge: 2014-09-09 | Disposition: A | Discharge status: Home / Self Care | Payer: BC Managed Care – PPO | Attending: Emergency Medicine | Admitting: Emergency Medicine

## 2014-09-09 ENCOUNTER — Encounter (HOSPITAL_COMMUNITY): Payer: Self-pay | Admitting: Emergency Medicine

## 2014-09-09 DIAGNOSIS — N4889 Other specified disorders of penis: Secondary | ICD-10-CM | POA: Insufficient documentation

## 2014-09-09 DIAGNOSIS — R1909 Other intra-abdominal and pelvic swelling, mass and lump: Secondary | ICD-10-CM | POA: Insufficient documentation

## 2014-09-09 HISTORY — DX: Disorder of kidney and ureter, unspecified: N28.9

## 2014-09-09 NOTE — ED Provider Notes (Signed)
CSN: [id number]     Arrival date & time 09/09/14  1753 History  This chart was scribed for Cleatrice Burke, PA-C working with Tanna Furry, MD by Randa Evens, ED Scribe. This patient was seen in room WTR8/WTR8 and the patient's care was started at 7:42 PM.    Chief Complaint  Patient presents with  . Groin Swelling   The history is provided by the patient. No language interpreter was used.   HPI Comments: Christian Pace is a 43 y.o. male who presents to the Emergency Department complaining of gradually worsening penile swelling under the head of his penis onset 4 days prior. He denies pain. He states that he has taken ibuprofen with some relief. Denies new sexual partners. He denies dysuria, testicle pain, discharge, pain with ejaculation.    Past Medical History  Diagnosis Date  . Renal disorder    Past Surgical History  Procedure Laterality Date  . Hernia repair     No family history on file. History  Substance Use Topics  . Smoking status: Never Smoker   . Smokeless tobacco: Not on file  . Alcohol Use: Yes    Review of Systems  Genitourinary: Positive for penile swelling. Negative for dysuria, discharge, penile pain and testicular pain.  All other systems reviewed and are negative.   Allergies  Review of patient's allergies indicates not on file.  Home Medications   Prior to Admission medications   Not on File   BP 133/74  Pulse 91  Temp(Src) 98.4 F (36.9 C) (Oral)  Resp 18  SpO2 100% Physical Exam  Nursing note and vitals reviewed. Constitutional: He is oriented to person, place, and time. He appears well-developed and well-nourished. No distress.  HENT:  Head: Normocephalic and atraumatic.  Right Ear: External ear normal.  Left Ear: External ear normal.  Nose: Nose normal.  Eyes: Conjunctivae are normal.  Neck: Normal range of motion. No tracheal deviation present.  Cardiovascular: Normal rate, regular rhythm and normal heart sounds.    Pulmonary/Chest: Effort normal and breath sounds normal. No stridor.  Abdominal: Soft. He exhibits no distension. There is no tenderness.  Genitourinary: Testes normal. Right testis shows no mass, no swelling and no tenderness. Left testis shows no mass, no swelling and no tenderness. Circumcised. No phimosis, paraphimosis, hypospadias, penile erythema or penile tenderness. No discharge found.  Minimal swelling to head of penis. No erythema, lesions, tenderness.  Musculoskeletal: Normal range of motion.  Neurological: He is alert and oriented to person, place, and time.  Skin: Skin is warm and dry. He is not diaphoretic.  Psychiatric: He has a normal mood and affect. His behavior is normal.    ED Course  Procedures (including critical care time)  COORDINATION OF CARE:  Labs Review Labs Reviewed - No data to display  Imaging Review No results found.   EKG Interpretation None      MDM   Final diagnoses:  Penile swelling   Patient presents to ED with penile swelling. Not consistent with chancre, herpes, gonorrhea, chlamydia. Has significantly improved with ibuprofen. Patient will continue ibuprofen and return if symptoms worsen. At this time no further workup is indicated. Dr. Jeneen Rinks evaluated patient and agrees with plan. Discussed reasons to return to ED. Vital signs stable for discharge. Patient / Family / Caregiver informed of clinical course, understand medical decision-making process, and agree with plan.   I personally performed the services described in this documentation, which was scribed in my presence. The recorded information has  been reviewed and is accurate.    Elwyn Lade, PA-C 09/13/14 [phone]

## 2014-09-09 NOTE — Discharge Instructions (Signed)
Continue ibuprofen. Return for worsening symptoms.

## 2014-09-09 NOTE — ED Notes (Addendum)
Per pt, states he has a lump on his penis since Friday-increased swelling-no dysuria

## 2014-09-13 NOTE — ED Provider Notes (Signed)
Medical screening examination/treatment/procedure(s) were conducted as a shared visit with non-physician practitioner(s) and myself.  I personally evaluated the patient during the encounter.   EKG Interpretation None      Vision seen and evaluated. No visual or obvious swelling at this time. Patient states it happened after intercourse several days ago. He took some Motrin this morning and is almost completely resolved. No breaking skin no erythema. No pain. Normal GU exam. I think expectant management is indicated at this time it hasn't recheck with any worsening recurrence or other symptoms. He was comfortable with this plan.  Tanna Furry, MD 09/13/14 [phone]

## 2015-11-27 ENCOUNTER — Emergency Department: Payer: BLUE CROSS/BLUE SHIELD

## 2015-11-27 ENCOUNTER — Emergency Department
Admission: EM | Admit: 2015-11-27 | Discharge: 2015-11-27 | Disposition: A | Discharge status: Home / Self Care | Payer: BLUE CROSS/BLUE SHIELD | Attending: Emergency Medicine | Admitting: Emergency Medicine

## 2015-11-27 DIAGNOSIS — R109 Unspecified abdominal pain: Secondary | ICD-10-CM | POA: Diagnosis not present

## 2015-11-27 DIAGNOSIS — R110 Nausea: Secondary | ICD-10-CM | POA: Diagnosis not present

## 2015-11-27 DIAGNOSIS — R197 Diarrhea, unspecified: Secondary | ICD-10-CM | POA: Insufficient documentation

## 2015-11-27 HISTORY — DX: Calculus of kidney: N20.0

## 2015-11-27 LAB — URINALYSIS COMPLETE WITH MICROSCOPIC (ARMC ONLY)
Bilirubin Urine: NEGATIVE
GLUCOSE, UA: NEGATIVE mg/dL
Ketones, ur: NEGATIVE mg/dL
LEUKOCYTES UA: NEGATIVE
NITRITE: NEGATIVE
PH: 5 (ref 5.0–8.0)
Protein, ur: NEGATIVE mg/dL
SPECIFIC GRAVITY, URINE: 1.02 (ref 1.005–1.030)
Squamous Epithelial / LPF: NONE SEEN

## 2015-11-27 LAB — CBC
HCT: 46.1 % (ref 40.0–52.0)
Hemoglobin: 15.4 g/dL (ref 13.0–18.0)
MCH: 29.6 pg (ref 26.0–34.0)
MCHC: 33.4 g/dL (ref 32.0–36.0)
MCV: 88.5 fL (ref 80.0–100.0)
PLATELETS: 226 10*3/uL (ref 150–440)
RBC: 5.21 MIL/uL (ref 4.40–5.90)
RDW: 13.4 % (ref 11.5–14.5)
WBC: 10.8 10*3/uL — AB (ref 3.8–10.6)

## 2015-11-27 LAB — BASIC METABOLIC PANEL
ANION GAP: 7 (ref 5–15)
BUN: 14 mg/dL (ref 6–20)
CHLORIDE: 107 mmol/L (ref 101–111)
CO2: 27 mmol/L (ref 22–32)
CREATININE: 1.48 mg/dL — AB (ref 0.61–1.24)
Calcium: 9 mg/dL (ref 8.9–10.3)
GFR calc non Af Amer: 57 mL/min — ABNORMAL LOW (ref >60)
Glucose, Bld: 147 mg/dL — ABNORMAL HIGH (ref 65–99)
Potassium: 4.1 mmol/L (ref 3.5–5.1)
Sodium: 141 mmol/L (ref 135–145)

## 2015-11-27 MED ORDER — ONDANSETRON HCL 4 MG PO TABS: 4.0000 mg | ORAL_TABLET | Freq: Three times a day (TID) | ORAL | Status: DC | PRN

## 2015-11-27 MED ORDER — SODIUM CHLORIDE 0.9 % IV BOLUS (SEPSIS)
500.0000 mL | Freq: Once | INTRAVENOUS | Status: AC
  Administered 2015-11-27: 500 mL via INTRAVENOUS

## 2015-11-27 MED ORDER — KETOROLAC TROMETHAMINE 30 MG/ML IJ SOLN
30.0000 mg | Freq: Once | INTRAMUSCULAR | Status: AC
  Administered 2015-11-27: 30 mg via INTRAVENOUS
  Filled 2015-11-27: qty 1

## 2015-11-27 MED ORDER — HYDROMORPHONE HCL 1 MG/ML IJ SOLN
1.0000 mg | Freq: Once | INTRAMUSCULAR | Status: AC
  Administered 2015-11-27: 1 mg via INTRAVENOUS
  Filled 2015-11-27: qty 1

## 2015-11-27 MED ORDER — ONDANSETRON HCL 4 MG/2ML IJ SOLN
4.0000 mg | Freq: Once | INTRAMUSCULAR | Status: AC
  Administered 2015-11-27: 4 mg via INTRAVENOUS
  Filled 2015-11-27: qty 2

## 2015-11-27 MED ORDER — HYDROMORPHONE HCL 2 MG PO TABS: 2.0000 mg | ORAL_TABLET | Freq: Two times a day (BID) | ORAL | Status: DC | PRN

## 2015-11-27 NOTE — ED Notes (Signed)
Pt c/o right flank pain since 4am with nausea.. States he has a hx of kidney stones and this feels the same

## 2015-11-27 NOTE — ED Provider Notes (Signed)
Time Seen: Approximately *----------------------------------------- 8:07 AM on 11/27/2015 -----------------------------------------   I have reviewed the triage notes  Chief Complaint: Flank Pain   History of Present Illness: Christian Pace is a 44 y.o. male who presents with acute onset of right-sided flank pain. Pain started with some nausea and dry heaves. He denies any continuous diarrhea but states he did have some loose stool this morning. He is not aware of any obvious dysuria, hematuria, urinary frequency. He denies any lower abdominal pain or anterior discomfort. He denies any testicular pain or masses. Patient has a history of renal colic and states this feels similar.   Past Medical History  Diagnosis Date  . Renal disorder   . Kidney stones     There are no active problems to display for this patient.   Past Surgical History  Procedure Laterality Date  . Hernia repair      Past Surgical History  Procedure Laterality Date  . Hernia repair      No current outpatient prescriptions on file.  Allergies:  Review of patient's allergies indicates no known allergies.  Family History: No family history on file.  Social History: Social History  Substance Use Topics  . Smoking status: Never Smoker   . Smokeless tobacco: None  . Alcohol Use: Yes     Review of Systems:   10 point review of systems was performed and was otherwise negative:  Constitutional: No fever Eyes: No visual disturbances ENT: No sore throat, ear pain Cardiac: No chest pain Respiratory: No shortness of breath, wheezing, or stridor Abdomen: No abdominal pain, no vomiting, No diarrhea Endocrine: No weight loss, No night sweats Extremities: No peripheral edema, cyanosis Skin: No rashes, easy bruising Neurologic: No focal weakness, trouble with speech or swollowing Urologic: No dysuria, Hematuria, or urinary frequency   Physical Exam:  ED Triage Vitals  Enc Vitals Group      BP 11/27/15 0737 146/87 mmHg     Pulse Rate 11/27/15 0737 105     Resp 11/27/15 0737 20     Temp 11/27/15 0737 97.9 F (36.6 C)     Temp Source 11/27/15 0737 Oral     SpO2 11/27/15 0737 98 %     Weight 11/27/15 0736 190 lb (86.183 kg)     Height 11/27/15 0736 6' (1.829 m)     Head Cir --      Peak Flow --      Pain Score 11/27/15 0736 10     Pain Loc --      Pain Edu? --      Excl. in [place]? --     General: Awake , Alert , and Oriented times 3; GCS 15 Head: Normal cephalic , atraumatic Eyes: Pupils equal , round, reactive to light Nose/Throat: No nasal drainage, patent upper airway without erythema or exudate.  Neck: Supple, Full range of motion, No anterior adenopathy or palpable thyroid masses Lungs: Clear to ascultation without wheezes , rhonchi, or rales Heart: Regular rate, regular rhythm without murmurs , gallops , or rubs Abdomen: Soft, non tender without rebound, guarding , or rigidity; bowel sounds positive and symmetric in all 4 quadrants. No organomegaly .        Extremities: 2 plus symmetric pulses. No edema, clubbing or cyanosis Neurologic: normal ambulation, Motor symmetric without deficits, sensory intact Skin: warm, dry, no rashes   Labs:   All laboratory work was reviewed including any pertinent negatives or positives listed below:  Labs Reviewed  CBC -  Abnormal; Notable for the following:    WBC 10.8 (*)    All other components within normal limits  URINALYSIS COMPLETEWITH MICROSCOPIC ([facility] ONLY)  BASIC METABOLIC PANEL   reviewed the patient's laboratory work showed hematuria with normal renal function and no signs of urinary tract infection     Radiology:   CLINICAL DATA: 44 year old male with right-sided flank pain since 4 a.m. Nausea. Prior history of kidney stones. History of bilateral inguinal hernia repair.  EXAM: CT ABDOMEN AND PELVIS WITHOUT CONTRAST  TECHNIQUE: Multidetector CT imaging of the abdomen and pelvis was performed following  the standard protocol without IV contrast.  COMPARISON: CT of the abdomen and pelvis 05/04/2013.  FINDINGS: Lower chest: Unremarkable.  Hepatobiliary: No definite cystic or solid hepatic lesions are identified on today's noncontrast CT examination. Unenhanced appearance of the gallbladder is normal.  Pancreas: No pancreatic mass or peripancreatic inflammatory changes on today's noncontrast CT examination.  Spleen: Unremarkable.  Adrenals/Urinary Tract: There are numerous nonobstructive calculi in the collecting systems of the kidneys bilaterally measuring up to 1.3 cm in the upper pole of the left kidney. In addition, there is a 10 mm calculus at the right ureteropelvic junction. There is also an 8 mm calculus at the left ureterovesicular junction. Neither of these are associated with significant proximal hydroureteronephrosis, although there is some very mild fullness in the right renal collecting system. No bladder calculi. Bilateral adrenal glands are normal in appearance.  Stomach/Bowel: Normal appearance of the stomach. No pathologic dilatation of small bowel or colon. Normal appendix.  Vascular/Lymphatic: No significant atherosclerotic calcifications or definite aneurysm identified in the abdominal or pelvic vasculature. No lymphadenopathy noted in the abdomen or pelvis on today's noncontrast CT examination.  Reproductive: Prostate gland and seminal vesicles are unremarkable in appearance.  Other: Postoperative changes of bilateral inguinal herniorrhaphy are noted. No significant volume of ascites. No pneumoperitoneum.  Musculoskeletal: There are no aggressive appearing lytic or blastic lesions noted in the visualized portions of the skeleton.  IMPRESSION: 1. In addition to numerous nonobstructive calculi in the collecting systems of the kidneys bilaterally, the patient has a 10 mm calculus at the right ureteropelvic junction, and a 8 mm calculus at the  left ureterovesicular junction. At this time, there is only mild fullness in the right renal collecting system, which could suggest very mild obstruction on the right side, but there is no associated left-sided hydroureteronephrosis. 2. Normal appendix. 3. Status post bilateral inguinal herniorrhaphy.   I personally reviewed the radiologic studies    ED Course: * Patient's stay here showed symptomatic improvement after IV Toradol, IV Dilaudid, and IV Zofran. He was able tolerate oral fluids. Patient's differential somewhat narrow giving his past history and felt this most likely was an episode of renal colic with a large stone located in the right collecting system. Patient may require outpatient evaluation by urology. I felt was unlikely to be a renal infarction, right lower lobe pneumonia, pyelonephritis, etc.    Assessment:  Right renal colic   Final Clinical Impression:   Final diagnoses:  Right flank pain     Plan:  Outpatient management Patient was advised to return immediately if condition worsens. Patient was advised to follow up with their primary care physician or other specialized physicians involved in their outpatient care           Daymon Larsen, MD 11/27/15 1119

## 2015-12-01 ENCOUNTER — Ambulatory Visit (INDEPENDENT_AMBULATORY_CARE_PROVIDER_SITE_OTHER): Payer: BLUE CROSS/BLUE SHIELD | Admitting: Obstetrics and Gynecology

## 2015-12-01 ENCOUNTER — Encounter: Payer: Self-pay | Admitting: Obstetrics and Gynecology

## 2015-12-01 VITALS — BP 134/88 | HR 99 | Temp 97.8°F | Resp 16 | Ht 72.0 in | Wt 199.3 lb

## 2015-12-01 DIAGNOSIS — N200 Calculus of kidney: Secondary | ICD-10-CM

## 2015-12-01 LAB — URINALYSIS, COMPLETE
Bilirubin, UA: NEGATIVE
Glucose, UA: NEGATIVE
LEUKOCYTES UA: NEGATIVE
Nitrite, UA: NEGATIVE
PH UA: 5.5 (ref 5.0–7.5)
Specific Gravity, UA: 1.025 (ref 1.005–1.030)
Urobilinogen, Ur: 0.2 mg/dL (ref 0.2–1.0)

## 2015-12-01 LAB — MICROSCOPIC EXAMINATION
Bacteria, UA: NONE SEEN
EPITHELIAL CELLS (NON RENAL): NONE SEEN /HPF (ref 0–10)
RBC, UA: >30 /hpf — ABNORMAL HIGH (ref 0–?)

## 2015-12-01 NOTE — Patient Instructions (Addendum)
Ureteral Stent  Patient Education A stent is a hollow tube that maintains patency until healing can take place or an obstruction is relieved. It allows urine to flow from the kidney to the bladder.  Indications for stenting include: Relief of ureteral obstruction (stones, cancer, stricture) and provide drainage; Promote healing of the ureter by providing internal support after a ureteral procedure  Prevent potential complications by helping place a guidewire into the ureter; Assist in dilating the ureter before the next ureteroscopy; Bypass obstructions, either from internal or external causes;  Procedure: Under general anesthesia in a cystoscopy suite, the ureteroscope is introduced into the bladder through the urethra and then up into the desired ureter.   Stent Removal: Remove in 2-7 days after ureteroscopy in uncomplicated cases; Remove in 1-2 weeks in cases of ureteral perforation or persisting concern of obstruction; A string may be left on your stent and you will be instructed by your provider when to gently pull the string to remove your stent at home.  Can be removed in the office with topical anesthesia with a flexible ureteroscope and grasper; Can be removed in the cysto suite under anesthesia for patients unable to tolerate topical anesthesia; If required for long term use (extrinsic compression by tumor, stricture), stents should be changed every 6 months  What to expect while stent is in place Blood in the urine intermittently while stent is in place. Usually it is most severe the first few days after surgery, but it can persist the entire time that the stent is in place. Symptoms of urinary frequency and urgency that is caused by the lower end of the stent coiling into and irritating the bladder.  Precautions: Do not have sexual intercourse while stent is in place or participate in other strenuous activity.  Please notify your physician's office as soon as possible  if: your stent falls out. It is very rare but if the stent comes out, please rinse it with water, place it in a ziplock bag and bring it with you to your appointment. you are experiencing extreme pain, prolonged nausea/vomiting or fever >100.5 F   [facility] [address] [place], [place] [address] [phone]                                                        Ureteroscopy  A Ureteroscopy is an examination of the upper urinary tract, performed with a ureteroscope that is passed through the urethra, the bladder, and then directly into the ureter. It is performed to find the cause of urine blockage in a ureter, perform a biopsy or identify and evaluate other abnormalities inside the ureters or kidneys. It is useful in the diagnosis and treatment of disorders such as kidney stones, ureteral strictures or other abnormalities of the ureter. Smaller stones in the bladder or lower ureter can be removed in one piece, while bigger ones are usually broken before removal during ureteroscopy.  *After your ureteroscopy, your doctor may need to place a stent in a ureter to drain urine from the kidney to the bladder while swelling in the ureter goes away. The stent may cause some discomfort to your kidney and ureter. The discomfort is generally mild. The stent may be left in for a week or more. You will be instructed to either  remove the stent yourself at home by pulling a string protruding from your urethra or to return to our office for removal by your doctor.  After a ureteroscopy you may  have a mild burning feeling when urinating see small amounts of blood in the urine have mild discomfort in the bladder area or kidney area when urinating need to urinate more frequently or urgently *These problems should not last more than 24 hours unless a ureteral stent was placed.  If a stent was placed symptoms symptoms may continue until it is removed. You should notify  your doctor right away if bleeding or pain is severe.  To prevent or relieve discomfort it can be helpful to drink 16 ounces of water each hour for 2 hours after the procedure take a warm bath to relieve the burning feeling hold a warm, damp washcloth over the urethral opening to relieve discomfort take an over-the-counter pain reliever  Please notify our office if you experience any of the following symptoms burning with urination lasting more than 24hrs Fever greater than 100F or present directly to emergency department abdominal or flank pain very bloody, cloudy or foul smelling urine  *If you have any additional questions or concerns please call our office at [phone]  [facility] [address] [place], [place] [address] [phone]            Dietary Guidelines to Help Prevent Kidney Stones Your risk of kidney stones can be decreased by adjusting the foods you eat. The most important thing you can do is drink enough fluid. You should drink enough fluid to keep your urine clear or pale yellow. The following guidelines provide specific information for the type of kidney stone you have had. GUIDELINES ACCORDING TO TYPE OF KIDNEY STONE Calcium Oxalate Kidney Stones  Reduce the amount of salt you eat. Foods that have a lot of salt cause your body to release excess calcium into your urine. The excess calcium can combine with a substance called oxalate to form kidney stones.  Reduce the amount of animal protein you eat if the amount you eat is excessive. Animal protein causes your body to release excess calcium into your urine. Ask your dietitian how much protein from animal sources you should be eating.  Avoid foods that are high in oxalates. If you take vitamins, they should have less than 500 mg of vitamin C. Your body turns vitamin C into oxalates. You do not need to avoid fruits and vegetables high in vitamin C. Calcium Phosphate  Kidney Stones  Reduce the amount of salt you eat to help prevent the release of excess calcium into your urine.  Reduce the amount of animal protein you eat if the amount you eat is excessive. Animal protein causes your body to release excess calcium into your urine. Ask your dietitian how much protein from animal sources you should be eating.  Get enough calcium from food or take a calcium supplement (ask your dietitian for recommendations). Food sources of calcium that do not increase your risk of kidney stones include:  Broccoli.  Dairy products, such as cheese and yogurt.  Pudding. Uric Acid Kidney Stones  Do not have more than 6 oz of animal protein per day. FOOD SOURCES Animal Protein Sources  Meat (all types).  Poultry.  Eggs.  Fish, seafood. Foods High in [employer] seasonings.  Soy sauce.  Teriyaki sauce.  Cured and processed meats.  Salted crackers and snack foods.  Fast  food.  Canned soups and most canned foods. Foods High in Oxalates  Grains:  Amaranth.  Barley.  Grits.  Wheat germ.  Bran.  Buckwheat flour.  All bran cereals.  Pretzels.  Whole wheat bread.  Vegetables:  Beans (wax).  Beets and beet greens.  Collard greens.  Eggplant.  Escarole.  Leeks.  Okra.  Parsley.  Rutabagas.  Spinach.  Swiss chard.  Tomato paste.  Fried potatoes.  Sweet potatoes.  Fruits:  Red currants.  Figs.  Kiwi.  Rhubarb.  Meat and Other Protein Sources:  Beans (dried).  Soy burgers and other soybean products.  Miso.  Nuts (peanuts, almonds, pecans, cashews, hazelnuts).  Nut butters.  Sesame seeds and tahini (paste made of sesame seeds).  Poppy seeds.  Beverages:  Chocolate drink mixes.  Soy milk.  Instant iced tea.  Juices made from high-oxalate fruits or vegetables.  Other:  Carob.  Chocolate.  Fruitcake.  Marmalades.   This information is not intended to replace advice given to you by  your health care provider. Make sure you discuss any questions you have with your health care provider.   Document Released: 04/01/2011 Document Revised: 12/10/2013 Document Reviewed: 11/01/2013 Elsevier Interactive Patient Education 2016 Elsevier [employer]. Kidney Stones Kidney stones (urolithiasis) are deposits that form inside your kidneys. The intense pain is caused by the stone moving through the urinary tract. When the stone moves, the ureter goes into spasm around the stone. The stone is usually passed in the urine.  CAUSES   A disorder that makes certain neck glands produce too much parathyroid hormone (primary hyperparathyroidism).  A buildup of uric acid crystals, similar to gout in your joints.  Narrowing (stricture) of the ureter.  A kidney obstruction present at birth (congenital obstruction).  Previous surgery on the kidney or ureters.  Numerous kidney infections. SYMPTOMS   Feeling sick to your stomach (nauseous).  Throwing up (vomiting).  Blood in the urine (hematuria).  Pain that usually spreads (radiates) to the groin.  Frequency or urgency of urination. DIAGNOSIS   Taking a history and physical exam.  Blood or urine tests.  CT scan.  Occasionally, an examination of the inside of the urinary bladder (cystoscopy) is performed. TREATMENT   Observation.  Increasing your fluid intake.  Extracorporeal shock wave lithotripsy--This is a noninvasive procedure that uses shock waves to break up kidney stones.  Surgery may be needed if you have severe pain or persistent obstruction. There are various surgical procedures. Most of the procedures are performed with the use of small instruments. Only small incisions are needed to accommodate these instruments, so recovery time is minimized. The size, location, and chemical composition are all important variables that will determine the proper choice of action for you. Talk to your health care provider to better  understand your situation so that you will minimize the risk of injury to yourself and your kidney.  HOME CARE INSTRUCTIONS   Drink enough water and fluids to keep your urine clear or pale yellow. This will help you to pass the stone or stone fragments.  Strain all urine through the provided strainer. Keep all particulate matter and stones for your health care provider to see. The stone causing the pain may be as small as a grain of salt. It is very important to use the strainer each and every time you pass your urine. The collection of your stone will allow your health care provider to analyze it and verify that a stone has actually passed. The stone  analysis will often identify what you can do to reduce the incidence of recurrences.  Only take over-the-counter or prescription medicines for pain, discomfort, or fever as directed by your health care provider.  Keep all follow-up visits as told by your health care provider. This is important.  Get follow-up X-rays if required. The absence of pain does not always mean that the stone has passed. It may have only stopped moving. If the urine remains completely obstructed, it can cause loss of kidney function or even complete destruction of the kidney. It is your responsibility to make sure X-rays and follow-ups are completed. Ultrasounds of the kidney can show blockages and the status of the kidney. Ultrasounds are not associated with any radiation and can be performed easily in a matter of minutes.  Make changes to your daily diet as told by your health care provider. You may be told to:  Limit the amount of salt that you eat.  Eat 5 or more servings of fruits and vegetables each day.  Limit the amount of meat, poultry, fish, and eggs that you eat.  Collect a 24-hour urine sample as told by your health care provider.You may need to collect another urine sample every 6-12 months. SEEK MEDICAL CARE IF:  You experience pain that is progressive and  unresponsive to any pain medicine you have been prescribed. SEEK IMMEDIATE MEDICAL CARE IF:   Pain cannot be controlled with the prescribed medicine.  You have a fever or shaking chills.  The severity or intensity of pain increases over 18 hours and is not relieved by pain medicine.  You develop a new onset of abdominal pain.  You feel faint or pass out.  You are unable to urinate.   This information is not intended to replace advice given to you by your health care provider. Make sure you discuss any questions you have with your health care provider.   Document Released: 12/05/2005 Document Revised: 08/26/2015 Document Reviewed: 05/08/2013 Elsevier Interactive Patient Education [employer].

## 2015-12-01 NOTE — Progress Notes (Signed)
12/01/2015 5:21 PM   Christian Pace 05/03/1971 [id number]  Referring provider: No referring provider defined for this encounter.  Chief Complaint  Patient presents with  . Nephrolithiasis  . Establish Care    HPI: It is a 44 year old male with a history of kidney stones presenting today for ER follow up after being seen for acute onset of right flank pain on 11/27/15. CT renal stone study was performed noting a 10 mm calculus at the right UPJ as well as an 8 mm calculus at the left UVJ. There was no hydrouretonephrosis seen bilaterally though there was some very mild fold last seen in the right renal collecting system. Many moderate to large nonobstructing renal stones also seen bilaterally. No fevers or gross hematuria. Some nausea and vomiting.  11/27/15 Creatinine 1.48. 2 years ago Cr 1.33 Normal serum calcium levels.  History of renal stones x 20 years. No previous stone interventions.  Reports that he thinks he has passed approximately 6 stones spontaneously.  History of previous hernia repair.  No problems with general anesthesia.  No cardiac or pulmonary issues.  Not taking anticoagulants.  PMH: Past Medical History  Diagnosis Date  . Renal disorder   . Kidney stones     Surgical History: Past Surgical History  Procedure Laterality Date  . Hernia repair      Home Medications:    Medication List       This list is accurate as of: 12/01/15  5:21 PM.  Always use your most recent med list.               HYDROmorphone 2 MG tablet  Commonly known as:  DILAUDID  Take 1 tablet (2 mg total) by mouth every 12 (twelve) hours as needed for severe pain.     ondansetron 4 MG tablet  Commonly known as:  ZOFRAN  Take 1 tablet (4 mg total) by mouth every 8 (eight) hours as needed for nausea or vomiting.        Allergies: No Known Allergies  Family History: No family history on file.  Social History:  reports that he has never smoked. He does not have any  smokeless tobacco history on file. He reports that he drinks alcohol. He reports that he does not use illicit drugs.  ROS: UROLOGY Frequent Urination?: Yes Hard to postpone urination?: No Burning/pain with urination?: Yes Get up at night to urinate?: Yes Leakage of urine?: No Urine stream starts and stops?: Yes Trouble starting stream?: Yes Do you have to strain to urinate?: No Blood in urine?: Yes Urinary tract infection?: No Sexually transmitted disease?: No Injury to kidneys or bladder?: No Painful intercourse?: No Weak stream?: Yes Erection problems?: No Penile pain?: No  Gastrointestinal Nausea?: Yes Vomiting?: No Indigestion/heartburn?: No Diarrhea?: No Constipation?: No  Constitutional Fever: No Night sweats?: No Weight loss?: No Fatigue?: No  Skin Skin rash/lesions?: No Itching?: No  Eyes Blurred vision?: No Double vision?: No  Ears/Nose/Throat Sore throat?: No Sinus problems?: No  Hematologic/Lymphatic Swollen glands?: No Easy bruising?: No  Cardiovascular Leg swelling?: No Chest pain?: No  Respiratory Cough?: No Shortness of breath?: No  Endocrine Excessive thirst?: Yes  Musculoskeletal Back pain?: Yes Joint pain?: No  Neurological Headaches?: No Dizziness?: No  Psychologic Depression?: No Anxiety?: No  Physical Exam: BP 134/88 mmHg  Pulse 99  Temp(Src) 97.8 F (36.6 C)  Resp 16  Ht 6' (1.829 m)  Wt 199 lb 4.8 oz (90.402 kg)  BMI 27.02 kg/m2  Constitutional:  Alert and oriented, No acute distress. HEENT: Calcium AT, moist mucus membranes.  Trachea midline, no masses. Cardiovascular: No clubbing, cyanosis, or edema, RRR Respiratory: Normal respiratory effort, no increased work of breathing, CTAB GI: Abdomen is soft, RUQ tenderness, nondistended, no abdominal masses GU: Right CVA tenderness Skin: No rashes, bruises or suspicious lesions. Lymph: No cervical or inguinal adenopathy. Neurologic: Grossly intact, no focal  deficits, moving all 4 extremities. Psychiatric: Normal mood and affect.  Laboratory Data:   Urinalysis    Component Value Date/Time   COLORURINE YELLOW* 11/27/2015 0744   COLORURINE Red 05/04/2013 1236   APPEARANCEUR CLEAR* 11/27/2015 0744   APPEARANCEUR Cloudy 05/04/2013 1236   LABSPEC 1.020 11/27/2015 0744   LABSPEC 1.020 05/04/2013 1236   PHURINE 5.0 11/27/2015 0744   PHURINE 6.0 05/04/2013 1236   GLUCOSEU Negative 12/01/2015 1051   GLUCOSEU Negative 05/04/2013 1236   HGBUR 3+* 11/27/2015 0744   HGBUR 3+ 05/04/2013 1236   BILIRUBINUR Negative 12/01/2015 1051   BILIRUBINUR NEGATIVE 11/27/2015 0744   BILIRUBINUR Negative 05/04/2013 1236   KETONESUR NEGATIVE 11/27/2015 0744   KETONESUR Negative 05/04/2013 1236   PROTEINUR NEGATIVE 11/27/2015 0744   PROTEINUR 100 mg/dL 05/04/2013 1236   NITRITE Negative 12/01/2015 1051   NITRITE NEGATIVE 11/27/2015 0744   NITRITE Negative 05/04/2013 1236   LEUKOCYTESUR Negative 12/01/2015 1051   LEUKOCYTESUR NEGATIVE 11/27/2015 0744   LEUKOCYTESUR Negative 05/04/2013 1236    Pertinent Imaging: CLINICAL DATA: 44 year old male with right-sided flank pain since 4 a.m. Nausea. Prior history of kidney stones. History of bilateral inguinal hernia repair.  EXAM: CT ABDOMEN AND PELVIS WITHOUT CONTRAST  TECHNIQUE: Multidetector CT imaging of the abdomen and pelvis was performed following the standard protocol without IV contrast.  COMPARISON: CT of the abdomen and pelvis 05/04/2013.  FINDINGS: Lower chest: Unremarkable.  Hepatobiliary: No definite cystic or solid hepatic lesions are identified on today's noncontrast CT examination. Unenhanced appearance of the gallbladder is normal.  Pancreas: No pancreatic mass or peripancreatic inflammatory changes on today's noncontrast CT examination.  Spleen: Unremarkable.  Adrenals/Urinary Tract: There are numerous nonobstructive calculi in the collecting systems of the kidneys  bilaterally measuring up to 1.3 cm in the upper pole of the left kidney. In addition, there is a 10 mm calculus at the right ureteropelvic junction. There is also an 8 mm calculus at the left ureterovesicular junction. Neither of these are associated with significant proximal hydroureteronephrosis, although there is some very mild fullness in the right renal collecting system. No bladder calculi. Bilateral adrenal glands are normal in appearance.  Stomach/Bowel: Normal appearance of the stomach. No pathologic dilatation of small bowel or colon. Normal appendix.  Vascular/Lymphatic: No significant atherosclerotic calcifications or definite aneurysm identified in the abdominal or pelvic vasculature. No lymphadenopathy noted in the abdomen or pelvis on today's noncontrast CT examination.  Reproductive: Prostate gland and seminal vesicles are unremarkable in appearance.  Other: Postoperative changes of bilateral inguinal herniorrhaphy are noted. No significant volume of ascites. No pneumoperitoneum.  Musculoskeletal: There are no aggressive appearing lytic or blastic lesions noted in the visualized portions of the skeleton.  IMPRESSION: 1. In addition to numerous nonobstructive calculi in the collecting systems of the kidneys bilaterally, the patient has a 10 mm calculus at the right ureteropelvic junction, and a 8 mm calculus at the left ureterovesicular junction. At this time, there is only mild fullness in the right renal collecting system, which could suggest very mild obstruction on the right side, but there is no associated left-sided hydroureteronephrosis. 2.  Normal appendix. 3. Status post bilateral inguinal herniorrhaphy. Electronically Signed  By: Vinnie Langton M.D.  On: 11/27/2015 08:30  Assessment & Plan:    1. Nephrolithiasis-  Numerous nonobstructive calculi in the collecting systems of the kidneys bilaterally, the patient has a 10 mm calculus at the  right ureteropelvic junction, and a 8 mm calculus at the left ureterovesicular junction. At this time, there is only mild fullness in the right renal collecting system, which could suggest very mild obstruction on the right side, but there is no associated left-sided hydroureteronephrosis. We discussed various treatment options including ESWL vs. ureteroscopy, laser lithotripsy, and stent. We discussed the risks and benefits of both including bleeding, infection, damage to surrounding structures, efficacy with need for possible further intervention, and need for temporary ureteral stent. Given the fact the patient has bilateral ureteral stones as well as significant renal stone burden I discussed this case with Dr. Erlene Quan and URS was recommended with bilateral stent placement. Patient understands that this will need to be a staged procedure with first surgery being bilateral stent placement and attempted left URS with laser lithotripsy for UVJ stone removal. He will later need to return to the OR for repeat ureteroscopy with laser lithotripsy for removal of remaining stones. Surgical risks and benefits reviewed including bleeding, infection, damage to surrounding organs and general anesthesia risks. Patient's questions were answered and he is in agreement with plan and willing to proceed with surgery. - Urinalysis, Complete -Serum parathyroid hormone  I spent 30 min with this patient of which greater than 50% was spent in counseling and coordination of care with the patient.   No Follow-up on file.  These notes generated with voice recognition software. I apologize for typographical errors.  Herbert Moors, [facility] [address] [place], [place] [address] [phone]

## 2015-12-02 ENCOUNTER — Encounter: Payer: Self-pay | Admitting: *Deleted

## 2015-12-02 ENCOUNTER — Ambulatory Visit: Payer: BLUE CROSS/BLUE SHIELD | Admitting: Anesthesiology

## 2015-12-02 ENCOUNTER — Ambulatory Visit
Admission: RE | Admit: 2015-12-02 | Discharge: 2015-12-02 | Disposition: A | Discharge status: Home / Self Care | Payer: BLUE CROSS/BLUE SHIELD | Source: Ambulatory Visit | Attending: Urology | Admitting: Urology

## 2015-12-02 ENCOUNTER — Encounter
Admission: RE | Disposition: A | Discharge status: Home / Self Care | Payer: Self-pay | Source: Ambulatory Visit | Attending: Urology

## 2015-12-02 DIAGNOSIS — N179 Acute kidney failure, unspecified: Secondary | ICD-10-CM

## 2015-12-02 DIAGNOSIS — N200 Calculus of kidney: Secondary | ICD-10-CM | POA: Diagnosis present

## 2015-12-02 DIAGNOSIS — N132 Hydronephrosis with renal and ureteral calculous obstruction: Secondary | ICD-10-CM | POA: Insufficient documentation

## 2015-12-02 DIAGNOSIS — Z79899 Other long term (current) drug therapy: Secondary | ICD-10-CM | POA: Insufficient documentation

## 2015-12-02 DIAGNOSIS — Z9889 Other specified postprocedural states: Secondary | ICD-10-CM | POA: Diagnosis not present

## 2015-12-02 DIAGNOSIS — N202 Calculus of kidney with calculus of ureter: Secondary | ICD-10-CM | POA: Diagnosis not present

## 2015-12-02 HISTORY — PX: CYSTOSCOPY WITH STENT PLACEMENT: SHX5790

## 2015-12-02 HISTORY — PX: URETEROSCOPY WITH HOLMIUM LASER LITHOTRIPSY: SHX6645

## 2015-12-02 LAB — BASIC METABOLIC PANEL
Anion gap: 9 (ref 5–15)
BUN: 22 mg/dL — AB (ref 6–20)
CHLORIDE: 104 mmol/L (ref 101–111)
CO2: 28 mmol/L (ref 22–32)
CREATININE: 2.14 mg/dL — AB (ref 0.61–1.24)
Calcium: 9 mg/dL (ref 8.9–10.3)
GFR calc Af Amer: 42 mL/min — ABNORMAL LOW (ref >60)
GFR calc non Af Amer: 36 mL/min — ABNORMAL LOW (ref >60)
GLUCOSE: 104 mg/dL — AB (ref 65–99)
POTASSIUM: 4.1 mmol/L (ref 3.5–5.1)
Sodium: 141 mmol/L (ref 135–145)

## 2015-12-02 LAB — PARATHYROID HORMONE, INTACT (NO CA): PTH: 21 pg/mL (ref 15–65)

## 2015-12-02 SURGERY — URETEROSCOPY, WITH LITHOTRIPSY USING HOLMIUM LASER
Anesthesia: General | Laterality: Left

## 2015-12-02 MED ORDER — OXYBUTYNIN CHLORIDE 5 MG PO TABS: 5.0000 mg | ORAL_TABLET | Freq: Three times a day (TID) | ORAL | Status: DC | PRN

## 2015-12-02 MED ORDER — LIDOCAINE HCL (CARDIAC) 20 MG/ML IV SOLN
INTRAVENOUS | Status: DC | PRN
  Administered 2015-12-02: 60 mg via INTRAVENOUS

## 2015-12-02 MED ORDER — MIDAZOLAM HCL 5 MG/5ML IJ SOLN
INTRAMUSCULAR | Status: DC | PRN
  Administered 2015-12-02: 2 mg via INTRAVENOUS

## 2015-12-02 MED ORDER — FENTANYL CITRATE (PF) 100 MCG/2ML IJ SOLN
INTRAMUSCULAR | Status: DC | PRN
Start: 2015-12-02 — End: 2015-12-02
  Administered 2015-12-02: 25 ug via INTRAVENOUS
  Administered 2015-12-02: 50 ug via INTRAVENOUS
  Administered 2015-12-02: 25 ug via INTRAVENOUS

## 2015-12-02 MED ORDER — FENTANYL CITRATE (PF) 100 MCG/2ML IJ SOLN
25.0000 ug | INTRAMUSCULAR | Status: DC | PRN
  Administered 2015-12-02 (×4): 25 ug via INTRAVENOUS

## 2015-12-02 MED ORDER — ONDANSETRON HCL 4 MG/2ML IJ SOLN: 4.0000 mg | Freq: Once | INTRAMUSCULAR | Status: DC | PRN

## 2015-12-02 MED ORDER — CEFAZOLIN SODIUM-DEXTROSE 2-3 GM-% IV SOLR
INTRAVENOUS | Status: AC
  Filled 2015-12-02: qty 50

## 2015-12-02 MED ORDER — FENTANYL CITRATE (PF) 100 MCG/2ML IJ SOLN
INTRAMUSCULAR | Status: AC
  Administered 2015-12-02: 25 ug via INTRAVENOUS
  Filled 2015-12-02: qty 2

## 2015-12-02 MED ORDER — IOTHALAMATE MEGLUMINE 43 % IV SOLN
INTRAVENOUS | Status: DC | PRN
  Administered 2015-12-02: 15 mL

## 2015-12-02 MED ORDER — CEFAZOLIN SODIUM-DEXTROSE 2-3 GM-% IV SOLR
2.0000 g | Freq: Once | INTRAVENOUS | Status: AC
  Administered 2015-12-02: 2 g via INTRAVENOUS

## 2015-12-02 MED ORDER — PHENAZOPYRIDINE HCL 200 MG PO TABS: 200.0000 mg | ORAL_TABLET | Freq: Three times a day (TID) | ORAL | Status: DC | PRN

## 2015-12-02 MED ORDER — PROPOFOL 10 MG/ML IV BOLUS
INTRAVENOUS | Status: DC | PRN
  Administered 2015-12-02: 200 mg via INTRAVENOUS

## 2015-12-02 MED ORDER — LACTATED RINGERS IV SOLN
Freq: Once | INTRAVENOUS | Status: AC
  Administered 2015-12-02: 15:00:00 via INTRAVENOUS

## 2015-12-02 MED ORDER — HYDROMORPHONE HCL 1 MG/ML IJ SOLN
0.2500 mg | INTRAMUSCULAR | Status: DC | PRN
  Administered 2015-12-02 (×2): 0.25 mg via INTRAVENOUS

## 2015-12-02 MED ORDER — TAMSULOSIN HCL 0.4 MG PO CAPS: 0.4000 mg | ORAL_CAPSULE | Freq: Every day | ORAL | Status: DC

## 2015-12-02 MED ORDER — LACTATED RINGERS IV SOLN
INTRAVENOUS | Status: DC | PRN
  Administered 2015-12-02: 17:00:00 via INTRAVENOUS

## 2015-12-02 MED ORDER — OXYCODONE-ACETAMINOPHEN 5-325 MG PO TABS: 1.0000 | ORAL_TABLET | ORAL | Status: DC | PRN

## 2015-12-02 MED ORDER — HYDROMORPHONE HCL 1 MG/ML IJ SOLN
INTRAMUSCULAR | Status: AC
  Administered 2015-12-02: 0.25 mg via INTRAVENOUS
  Filled 2015-12-02: qty 1

## 2015-12-02 MED ORDER — ONDANSETRON HCL 4 MG/2ML IJ SOLN
INTRAMUSCULAR | Status: DC | PRN
  Administered 2015-12-02: 4 mg via INTRAVENOUS

## 2015-12-02 SURGICAL SUPPLY — 35 items
ADAPTER SCOPE UROLOK II (MISCELLANEOUS) ×4 IMPLANT
ADPR INSRT BALL FIT URLK2 (MISCELLANEOUS) ×2
BAG DRAIN CYSTO-URO LG1000N (MISCELLANEOUS) ×4 IMPLANT
BASKET ZERO TIP 1.9FR (BASKET) ×4 IMPLANT
BSKT STON RTRVL ZERO TP 1.9FR (BASKET) ×2
CATH URETL 5X70 OPEN END (CATHETERS) ×4 IMPLANT
CNTNR SPEC 2.5X3XGRAD LEK (MISCELLANEOUS) ×2
CONRAY 43 FOR UROLOGY 50M (MISCELLANEOUS) ×4 IMPLANT
CONT SPEC 4OZ STER OR WHT (MISCELLANEOUS) ×2
CONT SPEC 4OZ STRL OR WHT (MISCELLANEOUS) ×2
CONTAINER SPEC 2.5X3XGRAD LEK (MISCELLANEOUS) ×2 IMPLANT
GLOVE BIO SURGEON STRL SZ 6.5 (GLOVE) ×3 IMPLANT
GLOVE BIO SURGEON STRL SZ7 (GLOVE) ×8 IMPLANT
GLOVE BIO SURGEONS STRL SZ 6.5 (GLOVE) ×1
GOWN STRL REUS W/ TWL LRG LVL4 (GOWN DISPOSABLE) ×4 IMPLANT
GOWN STRL REUS W/TWL LRG LVL4 (GOWN DISPOSABLE) ×8
INTRODUCER DILATOR DOUBLE (INTRODUCER) ×4 IMPLANT
KIT RM TURNOVER CYSTO AR (KITS) ×4 IMPLANT
LASER HOLMIUM 4 IN 1 (MISCELLANEOUS) ×2 IMPLANT
LASER HOLMIUM SU 200UM (MISCELLANEOUS) ×2 IMPLANT
PACK CYSTO AR (MISCELLANEOUS) ×4 IMPLANT
PREP PVP WINGED SPONGE (MISCELLANEOUS) ×4 IMPLANT
PUMP SINGLE ACTION SAP (PUMP) ×4 IMPLANT
SENSORWIRE 0.038 NOT ANGLED (WIRE) ×8
SET CYSTO W/LG BORE CLAMP LF (SET/KITS/TRAYS/PACK) ×4 IMPLANT
SHEATH URETERAL 12FRX35CM (MISCELLANEOUS) ×4 IMPLANT
SOL .9 NS 3000ML IRR  AL (IV SOLUTION) ×2
SOL .9 NS 3000ML IRR AL (IV SOLUTION) ×2
SOL .9 NS 3000ML IRR UROMATIC (IV SOLUTION) ×2 IMPLANT
STENT URET 6FRX24 CONTOUR (STENTS) ×2 IMPLANT
STENT URET 6FRX26 CONTOUR (STENTS) ×6 IMPLANT
SURGILUBE 2OZ TUBE FLIPTOP (MISCELLANEOUS) ×4 IMPLANT
SYRINGE IRR TOOMEY STRL 70CC (SYRINGE) ×4 IMPLANT
WATER STERILE IRR 1000ML POUR (IV SOLUTION) ×4 IMPLANT
WIRE SENSOR 0.038 NOT ANGLED (WIRE) ×4 IMPLANT

## 2015-12-02 NOTE — Op Note (Signed)
Date of procedure: 12/02/2015  Preoperative diagnosis:  1. Nephrolithiasis 2. Bilateral obstructing ureteral stones 3. Acute renal failure   Postoperative diagnosis:  1. Same as above   Procedure: 1. Bilateral retrograde pyelogram 2. Bilateral ureteral stent placement 3. Left ureteroscopy, laser lithotripsy  Surgeon: Hollice Espy, MD  Anesthesia: General  Complications: None  Intraoperative findings: Obstructing right proximal ureteral stone. Left distal ureteral stone treated. Bilateral ureteral stents placed.  EBL: Minimal  Specimens: Stone fragment  Drains: 6 x 26 French double-J ureteral stent 2  Indication: Christian Pace is a 44 y.o. patient with bilateral obstructing ureteral stones who presented to our clinic yesterday. He was also noted to have worsening renal function and advised to have bilateral ureteral stents placed today. In addition, we will go ahead and treat his left distal ureteral stone..  After reviewing the management options for treatment, he elected to proceed with the above surgical procedure(s). We have discussed the potential benefits and risks of the procedure, side effects of the proposed treatment, the likelihood of the patient achieving the goals of the procedure, and any potential problems that might occur during the procedure or recuperation. Informed consent has been obtained.  Description of procedure:  The patient was taken to the operating room and general anesthesia was induced.  The patient was placed in the dorsal lithotomy position, prepped and draped in the usual sterile fashion, and preoperative antibiotics were administered. A preoperative time-out was performed.   A rigid 21 [place] scope was advanced per urethra into the bladder.  Attention was turned to the right ureteral orifice which was cannulated using a 5 [place] open-ended ureteral catheter. A gentle retrograde pyelogram was performed revealing a delicate and decompress  ureter all the way up to level of the proximal ureter. Contrast not pass easily beyond this area presumably related to an obstructing ureteral stone which had been seen on previous CT scan. This could also be faintly seen on fluoroscopy. A sensor wire was able to be passed beyond this area into the renal pelvis without difficulty. A 6 x 26 French double-J ureteral stent was advanced over the wire into the renal pelvis. The wire was partially withdrawn until coil was noted within the renal pelvis. The wire was then fully withdrawn and a closed within the bladder.  Attention was then turned to the left ureteral orifice which was cannulated using a 5 [place] open-ended ureteral catheter as well. Again a gentle retrograde pyelogram was performed revealing a filling defect within the left distal ureter consistent with a left distal ureteral stone previously seen on imaging. The left kidney was mildly hydronephrotic. A wire was able to be passed beyond this into the renal pelvis without difficulty. The scope was then removed and exchanged for a semirigid ureteroscope which was passed into the distal ureter without difficulty. The stone was immediately encountered. A 200  laser fiber was then used using settings of 0.8 J and 10 Hz to fragment the stone into approximately 15 smaller pieces. Some of these pass spontaneously during ureteral manipulation and others were basketed using a 1.9 [place] nitinol basket. Once the distal ureter was satisfactorily cleared of stone, the wire was backloaded over the rigid cystoscope. A 6 x 26 French double-J ureteral stent was advanced over the wire into the renal pelvis. The wire was partially withdrawn until coil was noted within the renal pelvis. The wire was then fully withdrawn and a coil was noted within the bladder. The bladder was then drained. The  patient was then cleaned and dried. He was repositioned in the supine position, reversed from anesthesia, taken to the PACU in  stable condition.  Plan: Patient will return to clinic next week to discuss his plans for treating his bilateral renal stones and recheck labs. Given the extreme stone burden bilaterally, and he will likely need multiple procedures in the near future.  Hollice Espy, M.D.

## 2015-12-02 NOTE — H&P (View-Only) (Signed)
12/01/2015 5:21 PM   Christian Pace [birth year] [id number]  Referring provider: No referring provider defined for this encounter.  Chief Complaint  Patient presents with  . Nephrolithiasis  . Establish Care    HPI: It is a 44 year old male with a history of kidney stones presenting today for ER follow up after being seen for acute onset of right flank pain on 11/27/15. CT renal stone study was performed noting a 10 mm calculus at the right UPJ as well as an 8 mm calculus at the left UVJ. There was no hydrouretonephrosis seen bilaterally though there was some very mild fold last seen in the right renal collecting system. Many moderate to large nonobstructing renal stones also seen bilaterally. No fevers or gross hematuria. Some nausea and vomiting.  11/27/15 Creatinine 1.48. 2 years ago Cr 1.33 Normal serum calcium levels.  History of renal stones x 20 years. No previous stone interventions.  Reports that he thinks he has passed approximately 6 stones spontaneously.  History of previous hernia repair.  No problems with general anesthesia.  No cardiac or pulmonary issues.  Not taking anticoagulants.  PMH: Past Medical History  Diagnosis Date  . Renal disorder   . Kidney stones     Surgical History: Past Surgical History  Procedure Laterality Date  . Hernia repair      Home Medications:    Medication List       This list is accurate as of: 12/01/15  5:21 PM.  Always use your most recent med list.               HYDROmorphone 2 MG tablet  Commonly known as:  DILAUDID  Take 1 tablet (2 mg total) by mouth every 12 (twelve) hours as needed for severe pain.     ondansetron 4 MG tablet  Commonly known as:  ZOFRAN  Take 1 tablet (4 mg total) by mouth every 8 (eight) hours as needed for nausea or vomiting.        Allergies: No Known Allergies  Family History: No family history on file.  Social History:  reports that he has never smoked. He does not have any  smokeless tobacco history on file. He reports that he drinks alcohol. He reports that he does not use illicit drugs.  ROS: UROLOGY Frequent Urination?: Yes Hard to postpone urination?: No Burning/pain with urination?: Yes Get up at night to urinate?: Yes Leakage of urine?: No Urine stream starts and stops?: Yes Trouble starting stream?: Yes Do you have to strain to urinate?: No Blood in urine?: Yes Urinary tract infection?: No Sexually transmitted disease?: No Injury to kidneys or bladder?: No Painful intercourse?: No Weak stream?: Yes Erection problems?: No Penile pain?: No  Gastrointestinal Nausea?: Yes Vomiting?: No Indigestion/heartburn?: No Diarrhea?: No Constipation?: No  Constitutional Fever: No Night sweats?: No Weight loss?: No Fatigue?: No  Skin Skin rash/lesions?: No Itching?: No  Eyes Blurred vision?: No Double vision?: No  Ears/Nose/Throat Sore throat?: No Sinus problems?: No  Hematologic/Lymphatic Swollen glands?: No Easy bruising?: No  Cardiovascular Leg swelling?: No Chest pain?: No  Respiratory Cough?: No Shortness of breath?: No  Endocrine Excessive thirst?: Yes  Musculoskeletal Back pain?: Yes Joint pain?: No  Neurological Headaches?: No Dizziness?: No  Psychologic Depression?: No Anxiety?: No  Physical Exam: BP 134/88 mmHg  Pulse 99  Temp(Src) 97.8 F (36.6 C)  Resp 16  Ht 6' (1.829 m)  Wt 199 lb 4.8 oz (90.402 kg)  BMI 27.02 kg/m2  Constitutional:  Alert and oriented, No acute distress. HEENT: [place] AT, moist mucus membranes.  Trachea midline, no masses. Cardiovascular: No clubbing, cyanosis, or edema, RRR Respiratory: Normal respiratory effort, no increased work of breathing, CTAB GI: Abdomen is soft, RUQ tenderness, nondistended, no abdominal masses GU: Right CVA tenderness Skin: No rashes, bruises or suspicious lesions. Lymph: No cervical or inguinal adenopathy. Neurologic: Grossly intact, no focal  deficits, moving all 4 extremities. Psychiatric: Normal mood and affect.  Laboratory Data:   Urinalysis    Component Value Date/Time   COLORURINE YELLOW* 11/27/2015 0744   COLORURINE Red 05/04/2013 1236   APPEARANCEUR CLEAR* 11/27/2015 0744   APPEARANCEUR Cloudy 05/04/2013 1236   LABSPEC 1.020 11/27/2015 0744   LABSPEC 1.020 05/04/2013 1236   PHURINE 5.0 11/27/2015 0744   PHURINE 6.0 05/04/2013 1236   GLUCOSEU Negative 12/01/2015 1051   GLUCOSEU Negative 05/04/2013 1236   HGBUR 3+* 11/27/2015 0744   HGBUR 3+ 05/04/2013 1236   BILIRUBINUR Negative 12/01/2015 1051   BILIRUBINUR NEGATIVE 11/27/2015 0744   BILIRUBINUR Negative 05/04/2013 1236   KETONESUR NEGATIVE 11/27/2015 0744   KETONESUR Negative 05/04/2013 1236   PROTEINUR NEGATIVE 11/27/2015 0744   PROTEINUR 100 mg/dL 05/04/2013 1236   NITRITE Negative 12/01/2015 1051   NITRITE NEGATIVE 11/27/2015 0744   NITRITE Negative 05/04/2013 1236   LEUKOCYTESUR Negative 12/01/2015 1051   LEUKOCYTESUR NEGATIVE 11/27/2015 0744   LEUKOCYTESUR Negative 05/04/2013 1236    Pertinent Imaging: CLINICAL DATA: 44 year old male with right-sided flank pain since 4 a.m. Nausea. Prior history of kidney stones. History of bilateral inguinal hernia repair.  EXAM: CT ABDOMEN AND PELVIS WITHOUT CONTRAST  TECHNIQUE: Multidetector CT imaging of the abdomen and pelvis was performed following the standard protocol without IV contrast.  COMPARISON: CT of the abdomen and pelvis 05/04/2013.  FINDINGS: Lower chest: Unremarkable.  Hepatobiliary: No definite cystic or solid hepatic lesions are identified on today's noncontrast CT examination. Unenhanced appearance of the gallbladder is normal.  Pancreas: No pancreatic mass or peripancreatic inflammatory changes on today's noncontrast CT examination.  Spleen: Unremarkable.  Adrenals/Urinary Tract: There are numerous nonobstructive calculi in the collecting systems of the kidneys  bilaterally measuring up to 1.3 cm in the upper pole of the left kidney. In addition, there is a 10 mm calculus at the right ureteropelvic junction. There is also an 8 mm calculus at the left ureterovesicular junction. Neither of these are associated with significant proximal hydroureteronephrosis, although there is some very mild fullness in the right renal collecting system. No bladder calculi. Bilateral adrenal glands are normal in appearance.  Stomach/Bowel: Normal appearance of the stomach. No pathologic dilatation of small bowel or colon. Normal appendix.  Vascular/Lymphatic: No significant atherosclerotic calcifications or definite aneurysm identified in the abdominal or pelvic vasculature. No lymphadenopathy noted in the abdomen or pelvis on today's noncontrast CT examination.  Reproductive: Prostate gland and seminal vesicles are unremarkable in appearance.  Other: Postoperative changes of bilateral inguinal herniorrhaphy are noted. No significant volume of ascites. No pneumoperitoneum.  Musculoskeletal: There are no aggressive appearing lytic or blastic lesions noted in the visualized portions of the skeleton.  IMPRESSION: 1. In addition to numerous nonobstructive calculi in the collecting systems of the kidneys bilaterally, the patient has a 10 mm calculus at the right ureteropelvic junction, and a 8 mm calculus at the left ureterovesicular junction. At this time, there is only mild fullness in the right renal collecting system, which could suggest very mild obstruction on the right side, but there is no associated left-sided hydroureteronephrosis. 2.  Normal appendix. 3. Status post bilateral inguinal herniorrhaphy. Electronically Signed  By: Vinnie Langton M.D.  On: 11/27/2015 08:30  Assessment & Plan:    1. Nephrolithiasis-  Numerous nonobstructive calculi in the collecting systems of the kidneys bilaterally, the patient has a 10 mm calculus at the  right ureteropelvic junction, and a 8 mm calculus at the left ureterovesicular junction. At this time, there is only mild fullness in the right renal collecting system, which could suggest very mild obstruction on the right side, but there is no associated left-sided hydroureteronephrosis. We discussed various treatment options including ESWL vs. ureteroscopy, laser lithotripsy, and stent. We discussed the risks and benefits of both including bleeding, infection, damage to surrounding structures, efficacy with need for possible further intervention, and need for temporary ureteral stent. Given the fact the patient has bilateral ureteral stones as well as significant renal stone burden I discussed this case with Dr. Erlene Quan and URS was recommended with bilateral stent placement. Patient understands that this will need to be a staged procedure with first surgery being bilateral stent placement and attempted left URS with laser lithotripsy for UVJ stone removal. He will later need to return to the OR for repeat ureteroscopy with laser lithotripsy for removal of remaining stones. Surgical risks and benefits reviewed including bleeding, infection, damage to surrounding organs and general anesthesia risks. Patient's questions were answered and he is in agreement with plan and willing to proceed with surgery. - Urinalysis, Complete -Serum parathyroid hormone  I spent 30 min with this patient of which greater than 50% was spent in counseling and coordination of care with the patient.   No Follow-up on file.  These notes generated with voice recognition software. I apologize for typographical errors.  Herbert Moors, [facility] [address] [place], [place] [address] [phone]

## 2015-12-02 NOTE — Transfer of Care (Signed)
Immediate Anesthesia Transfer of Care Note  Patient: Christian Pace  Procedure(s) Performed: Procedure(s): URETEROSCOPY WITH HOLMIUM LASER LITHOTRIPSY WITH RETROGRADE PYLOGRAM (Left) CYSTOSCOPY WITH STENT PLACEMENT,bilateral retrograde pylorograms (Bilateral)  Patient Location: PACU  Anesthesia Type:General  Level of Consciousness: sedated  Airway & Oxygen Therapy: Patient Spontanous Breathing and Patient connected to face mask oxygen  Post-op Assessment: Report given to RN  Post vital signs: Reviewed and stable  Last Vitals:  Filed Vitals:   12/02/15 1445 12/02/15 1803  BP: 124/86 104/63  Pulse: 101 94  Temp: 36.8 C 37.2 C  Resp: 16 19    Complications: No apparent anesthesia complications

## 2015-12-02 NOTE — Anesthesia Preprocedure Evaluation (Signed)
Anesthesia Evaluation  Patient identified by MRN, date of birth, ID band Patient awake    Reviewed: Allergy & Precautions, H&P , NPO status , Patient's Chart, lab work & pertinent test results, reviewed documented beta blocker date and time   Airway Mallampati: II  TM Distance: >3 FB Neck ROM: full    Dental  (+) Teeth Intact   Pulmonary neg pulmonary ROS,    Pulmonary exam normal        Cardiovascular Exercise Tolerance: Good negative cardio ROS Normal cardiovascular exam Rate:Normal     Neuro/Psych negative neurological ROS  negative psych ROS   GI/Hepatic negative GI ROS, Neg liver ROS,   Endo/Other  negative endocrine ROS  Renal/GU Renal diseasenegative Renal ROS  negative genitourinary   Musculoskeletal   Abdominal   Peds  Hematology negative hematology ROS (+)   Anesthesia Other Findings   Reproductive/Obstetrics negative OB ROS                             Anesthesia Physical Anesthesia Plan  ASA: II  Anesthesia Plan: General LMA   Post-op Pain Management:    Induction:   Airway Management Planned:   Additional Equipment:   Intra-op Plan:   Post-operative Plan:   Informed Consent: I have reviewed the patients History and Physical, chart, labs and discussed the procedure including the risks, benefits and alternatives for the proposed anesthesia with the patient or authorized representative who has indicated his/her understanding and acceptance.     Plan Discussed with: CRNA  Anesthesia Plan Comments:         Anesthesia Quick Evaluation

## 2015-12-02 NOTE — Progress Notes (Signed)
   12/02/15 1540  Clinical Encounter Type  Visited With Patient  Visit Type Initial  Provided pastoral support and presence to patient in the pre op area of same day surgery. Anton [phone]

## 2015-12-02 NOTE — Anesthesia Procedure Notes (Signed)
Procedure Name: LMA Insertion Date/Time: 12/02/2015 4:47 PM Performed by: Dionne Bucy Pre-anesthesia Checklist: Patient identified, Patient being monitored, Timeout performed, Emergency Drugs available and Suction available Patient Re-evaluated:Patient Re-evaluated prior to inductionOxygen Delivery Method: Circle system utilized Preoxygenation: Pre-oxygenation with 100% oxygen Intubation Type: IV induction Ventilation: Mask ventilation without difficulty LMA: LMA inserted LMA Size: 5.0 Tube type: Oral Number of attempts: 1 Placement Confirmation: positive ETCO2 and breath sounds checked- equal and bilateral Tube secured with: Tape Dental Injury: Teeth and Oropharynx as per pre-operative assessment

## 2015-12-02 NOTE — Interval H&P Note (Signed)
History and Physical Interval Note:  12/02/2015 4:08 PM  Christian Pace  has presented today for surgery, with the diagnosis of KIDNEY STONES  The various methods of treatment have been discussed with the patient and family. After consideration of risks, benefits and other options for treatment, the patient has consented to  Procedure(s): URETEROSCOPY WITH HOLMIUM LASER LITHOTRIPSY (Left) CYSTOSCOPY WITH STENT PLACEMENT (Bilateral) as a surgical intervention .  The patient's history has been reviewed, patient examined, no change in status, stable for surgery.  I have reviewed the patient's chart and labs.  Questions were answered to the patient's satisfaction.    RRR CTAB Preop UA negative other than for RBC   Hollice Espy

## 2015-12-02 NOTE — Discharge Instructions (Signed)
You have a ureteral stent in place.  This is a tube that extends from your kidney to your bladder.  This may cause urinary bleeding, burning with urination, and urinary frequency.  Please call our office or present to the ED if you develop fevers >101 or pain which is not able to be controlled with oral pain medications.  You may be given either Flomax and/ or ditropan to help with bladder spasms and stent pain in addition to pain medications.    Your stent is only temporary until your next procedure. This must be removed in the near future. There can be complications in it is not removed including kidney loss and infections.  [facility] [address] [place], [place] [address] [phone] AMBULATORY SURGERY  DISCHARGE INSTRUCTIONS   1) The drugs that you were given will stay in your system until tomorrow so for the next 24 hours you should not:  A) Drive an automobile B) Make any legal decisions C) Drink any alcoholic beverage   2) You may resume regular meals tomorrow.  Today it is better to start with liquids and gradually work up to solid foods.  You may eat anything you prefer, but it is better to start with liquids, then soup and crackers, and gradually work up to solid foods.   3) Please notify your doctor immediately if you have any unusual bleeding, trouble breathing, redness and pain at the surgery site, drainage, fever, or pain not relieved by medication. 4)   5) Your post-operative visit with Dr.                                     is: Date:                        Time:    Please call to schedule your post-operative visit.  6) Additional Instructions: 7)

## 2015-12-03 ENCOUNTER — Telehealth: Payer: Self-pay | Admitting: Radiology

## 2015-12-03 ENCOUNTER — Encounter: Payer: Self-pay | Admitting: Urology

## 2015-12-03 LAB — CULTURE, URINE COMPREHENSIVE

## 2015-12-03 NOTE — Telephone Encounter (Signed)
-----   Message from Hollice Espy, MD sent at 12/02/2015  6:08 PM EST ----- This guy needs to be rebooked for bilateral ureteroscopy, laser lithotripsy, stent exchange in a few weeks.    I want him to come back into the office for labs/ urine culture and discuss plan next week.    Hollice Espy, MD

## 2015-12-03 NOTE — Telephone Encounter (Signed)
Appt made for labs/urine cx with Dr Erlene Quan on 12/21 @3 :15. Pt aware.

## 2015-12-04 NOTE — Anesthesia Postprocedure Evaluation (Signed)
Anesthesia Post Note  Patient: Christian Pace  Procedure(s) Performed: Procedure(s) (LRB): URETEROSCOPY WITH HOLMIUM LASER LITHOTRIPSY WITH RETROGRADE PYLOGRAM (Left) CYSTOSCOPY WITH STENT PLACEMENT,bilateral retrograde pylorograms (Bilateral)  Patient location during evaluation: PACU Anesthesia Type: General Level of consciousness: awake and alert Pain management: pain level controlled Vital Signs Assessment: post-procedure vital signs reviewed and stable Respiratory status: spontaneous breathing, nonlabored ventilation, respiratory function stable and patient connected to nasal cannula oxygen Cardiovascular status: blood pressure returned to baseline and stable Anesthetic complications: no    Last Vitals:  Filed Vitals:   12/02/15 1853 12/02/15 1927  BP: 116/67 116/70  Pulse: 87   Temp: 36.7 C   Resp: 16     Last Pain:  Filed Vitals:   12/03/15 0945  PainSc: [place]

## 2015-12-07 LAB — STONE ANALYSIS
CA OXALATE, MONOHYDR.: 97 %
Ca phos cry stone ql IR: 3 %
STONE WEIGHT KSTONE: 13 mg

## 2015-12-09 ENCOUNTER — Encounter: Payer: Self-pay | Admitting: Urology

## 2015-12-09 ENCOUNTER — Ambulatory Visit (INDEPENDENT_AMBULATORY_CARE_PROVIDER_SITE_OTHER): Payer: BLUE CROSS/BLUE SHIELD | Admitting: Urology

## 2015-12-09 VITALS — BP 155/81 | HR 90 | Ht 72.0 in | Wt 196.8 lb

## 2015-12-09 DIAGNOSIS — H6192 Disorder of left external ear, unspecified: Secondary | ICD-10-CM | POA: Diagnosis not present

## 2015-12-09 DIAGNOSIS — N200 Calculus of kidney: Secondary | ICD-10-CM | POA: Diagnosis not present

## 2015-12-09 DIAGNOSIS — N179 Acute kidney failure, unspecified: Secondary | ICD-10-CM | POA: Diagnosis not present

## 2015-12-09 LAB — URINALYSIS, COMPLETE
BILIRUBIN UA: NEGATIVE
GLUCOSE, UA: NEGATIVE
Ketones, UA: NEGATIVE
NITRITE UA: NEGATIVE
Specific Gravity, UA: 1.015 (ref 1.005–1.030)
UUROB: 0.2 mg/dL (ref 0.2–1.0)
pH, UA: 6.5 (ref 5.0–7.5)

## 2015-12-09 LAB — MICROSCOPIC EXAMINATION

## 2015-12-09 NOTE — Progress Notes (Signed)
4:54 PM  12/09/2015   Aviraj Ells [facility] [birth year] [id number]  Referring provider: No referring provider defined for this encounter.  Chief Complaint  Patient presents with  . Nephrolithiasis  . Pre-op Exam    HPI: 44 year old male with a history of kidney stones presenting today for ER follow up after being seen for acute onset of right flank pain on 11/27/15. CT renal stone study was performed noting a 10 mm calculus at the right UPJ as well as an 8 mm calculus at the left UVJ. Many moderate to large nonobstructing renal stones also seen bilaterally. No fevers or gross hematuria.   He was taken urgently to the operating room on 12/01/2014 for bilateral ureteral stent placement, and treatment of his left distal ureteral stone with laser lithotripsy.  Preop labs showed worsening renal function, creatinine 2.1 for the time of the procedure one week ago.  It appears that his baseline is likely 1.33 from 2 years ago.   History of renal stones x 20 years. No previous stone interventions.  Reports that he thinks he has passed approximately 6 stones spontaneously.  Return to the office today for preop culture as well as to discuss treatment challenging for his innumerable bilateral stones.  PMH: Past Medical History  Diagnosis Date  . Renal disorder   . Kidney stones     Surgical History: Past Surgical History  Procedure Laterality Date  . Hernia repair    . Ureteroscopy with holmium laser lithotripsy Left 12/02/2015    Procedure: URETEROSCOPY WITH HOLMIUM LASER LITHOTRIPSY WITH RETROGRADE PYLOGRAM;  Surgeon: Hollice Espy, MD;  Location: [facility] ORS;  Service: Urology;  Laterality: Left;  . Cystoscopy with stent placement Bilateral 12/02/2015    Procedure: CYSTOSCOPY WITH STENT PLACEMENT,bilateral retrograde pylorograms;  Surgeon: Hollice Espy, MD;  Location: [facility] ORS;  Service: Urology;  Laterality: Bilateral;    Home Medications:    Medication List       This list is  accurate as of: 12/09/15  4:54 PM.  Always use your most recent med list.               HYDROmorphone 2 MG tablet  Commonly known as:  DILAUDID  Take 1 tablet (2 mg total) by mouth every 12 (twelve) hours as needed for severe pain.     ondansetron 4 MG tablet  Commonly known as:  ZOFRAN  Take 1 tablet (4 mg total) by mouth every 8 (eight) hours as needed for nausea or vomiting.     oxybutynin 5 MG tablet  Commonly known as:  DITROPAN  Take 1 tablet (5 mg total) by mouth every 8 (eight) hours as needed for bladder spasms.     oxyCODONE-acetaminophen 5-325 MG tablet  Commonly known as:  PERCOCET  Take 1-2 tablets by mouth every 4 (four) hours as needed for moderate pain or severe pain.     phenazopyridine 200 MG tablet  Commonly known as:  PYRIDIUM  Take 1 tablet (200 mg total) by mouth 3 (three) times daily as needed for pain.     tamsulosin 0.4 MG Caps capsule  Commonly known as:  FLOMAX  Take 1 capsule (0.4 mg total) by mouth daily.        Allergies: No Known Allergies  Family History: History reviewed. No pertinent family history.  Social History:  reports that he has never smoked. He does not have any smokeless tobacco history on file. He reports that he drinks alcohol. He reports that he does not use illicit  drugs.  ROS: UROLOGY Frequent Urination?: Yes Hard to postpone urination?: No Burning/pain with urination?: Yes Get up at night to urinate?: No Leakage of urine?: No Urine stream starts and stops?: No Trouble starting stream?: No Do you have to strain to urinate?: No Blood in urine?: No Urinary tract infection?: No Sexually transmitted disease?: No Injury to kidneys or bladder?: No Painful intercourse?: No Weak stream?: No Erection problems?: No Penile pain?: No  Gastrointestinal Nausea?: No Vomiting?: No Indigestion/heartburn?: No Diarrhea?: No Constipation?: No  Constitutional Fever: No Night sweats?: No Weight loss?: No Fatigue?:  No  Skin Skin rash/lesions?: No Itching?: No  Eyes Blurred vision?: No Double vision?: No  Ears/Nose/Throat Sore throat?: No Sinus problems?: No  Hematologic/Lymphatic Swollen glands?: No Easy bruising?: No  Cardiovascular Leg swelling?: No Chest pain?: No  Respiratory Cough?: No Shortness of breath?: No  Endocrine Excessive thirst?: No  Musculoskeletal Back pain?: No Joint pain?: No  Neurological Headaches?: No Dizziness?: No  Psychologic Depression?: No Anxiety?: No  Physical Exam: BP 155/81 mmHg  Pulse 90  Ht 6' (1.829 m)  Wt 196 lb 12.8 oz (89.268 kg)  BMI 26.69 kg/m2  Constitutional:  Alert and oriented, No acute distress. HEENT: [place] AT, moist mucus membranes.  Trachea midline, no masses.  Left erosion of the year. Cardiovascular: No clubbing, cyanosis, or edema, RRR Respiratory: Normal respiratory effort, no increased work of breathing, CTAB GI: Abdomen is soft, nondistended, no abdominal masses GU: No CVA tenderness. Skin: No rashes, bruises or suspicious lesions. Neurologic: Grossly intact, no focal deficits, moving all 4 extremities. Psychiatric: Normal mood and affect.  Laboratory Data:   Urinalysis Results for orders placed or performed in visit on 12/09/15  Microscopic Examination  Result Value Ref Range   WBC, UA 6-10 (A) 0 -  5 /hpf   RBC, UA >30 (H) 0 -  2 /hpf   Epithelial Cells (non renal) 0-10 0 - 10 /hpf   Mucus, UA Present (A) Not Estab.   Bacteria, UA Few (A) None seen/Few  Urinalysis, Complete  Result Value Ref Range   Specific Gravity, UA 1.015 1.005 - 1.030   pH, UA 6.5 5.0 - 7.5   Color, UA Yellow Yellow   Appearance Ur Cloudy (A) Clear   Leukocytes, UA 2+ (A) Negative   Protein, UA 1+ (A) Negative/Trace   Glucose, UA Negative Negative   Ketones, UA Negative Negative   RBC, UA 3+ (A) Negative   Bilirubin, UA Negative Negative   Urobilinogen, Ur 0.2 0.2 - 1.0 mg/dL   Nitrite, UA Negative Negative   Microscopic  Examination See below:     Pertinent Imaging: CLINICAL DATA: 44 year old male with right-sided flank pain since 4 a.m. Nausea. Prior history of kidney stones. History of bilateral inguinal hernia repair.  EXAM: CT ABDOMEN AND PELVIS WITHOUT CONTRAST  TECHNIQUE: Multidetector CT imaging of the abdomen and pelvis was performed following the standard protocol without IV contrast.  COMPARISON: CT of the abdomen and pelvis 05/04/2013.  FINDINGS: Lower chest: Unremarkable.  Hepatobiliary: No definite cystic or solid hepatic lesions are identified on today's noncontrast CT examination. Unenhanced appearance of the gallbladder is normal.  Pancreas: No pancreatic mass or peripancreatic inflammatory changes on today's noncontrast CT examination.  Spleen: Unremarkable.  Adrenals/Urinary Tract: There are numerous nonobstructive calculi in the collecting systems of the kidneys bilaterally measuring up to 1.3 cm in the upper pole of the left kidney. In addition, there is a 10 mm calculus at the right ureteropelvic junction. There  is also an 8 mm calculus at the left ureterovesicular junction. Neither of these are associated with significant proximal hydroureteronephrosis, although there is some very mild fullness in the right renal collecting system. No bladder calculi. Bilateral adrenal glands are normal in appearance.  Stomach/Bowel: Normal appearance of the stomach. No pathologic dilatation of small bowel or colon. Normal appendix.  Vascular/Lymphatic: No significant atherosclerotic calcifications or definite aneurysm identified in the abdominal or pelvic vasculature. No lymphadenopathy noted in the abdomen or pelvis on today's noncontrast CT examination.  Reproductive: Prostate gland and seminal vesicles are unremarkable in appearance.  Other: Postoperative changes of bilateral inguinal herniorrhaphy are noted. No significant volume of ascites. No  pneumoperitoneum.  Musculoskeletal: There are no aggressive appearing lytic or blastic lesions noted in the visualized portions of the skeleton.  IMPRESSION: 1. In addition to numerous nonobstructive calculi in the collecting systems of the kidneys bilaterally, the patient has a 10 mm calculus at the right ureteropelvic junction, and a 8 mm calculus at the left ureterovesicular junction. At this time, there is only mild fullness in the right renal collecting system, which could suggest very mild obstruction on the right side, but there is no associated left-sided hydroureteronephrosis. 2. Normal appendix. 3. Status post bilateral inguinal herniorrhaphy. Electronically Signed  By: Vinnie Langton M.D.  On: 11/27/2015 08:30  Assessment & Plan:    1. Nephrolithiasis Significant stone burden bilaterally. CT imaging reviewed again today with the patient. Plan for staged ureteroscopy to clear him on his left side with a lesser stone burden. We discussed consideration for possible right PCNL in the future in order to optimize his stone clearance with the fewest number of procedures. We'll go ahead and plan for proceeding with left ureteroscopy, possible right ureteroscopy in the next few weeks.  Reviewed risk and benefits of the procedure. He is willing to proceed as planned.  - Urinalysis, Complete - CULTURE, URINE COMPREHENSIVE - Basic metabolic panel  2. Acute renal failure, unspecified acute renal failure type (Frank) Recheck BMP today, bilateral stents now placed. -- Basic metabolic panel  3. 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 Does not have the PCP. Request dermatology referral today. - Ambulatory referral to Dermatology   Hollice Espy, MD  [facility] [address], [place], [place] [address] [phone]

## 2015-12-10 ENCOUNTER — Telehealth: Payer: Self-pay | Admitting: Radiology

## 2015-12-10 LAB — BASIC METABOLIC PANEL
BUN / CREAT RATIO: 10 (ref 9–20)
BUN: 13 mg/dL (ref 6–24)
CO2: 26 mmol/L (ref 18–29)
CREATININE: 1.34 mg/dL — AB (ref 0.76–1.27)
Calcium: 9.4 mg/dL (ref 8.7–10.2)
Chloride: 100 mmol/L (ref 96–106)
GFR, EST AFRICAN AMERICAN: 75 mL/min/{1.73_m2} (ref >59)
GFR, EST NON AFRICAN AMERICAN: 65 mL/min/{1.73_m2} (ref >59)
GLUCOSE: 87 mg/dL (ref 65–99)
Potassium: 4.4 mmol/L (ref 3.5–5.2)
Sodium: 142 mmol/L (ref 134–144)

## 2015-12-10 NOTE — Telephone Encounter (Signed)
Notified pt of surgery scheduled 01/04/16, pre-admit testing phone interview on 12/28/15 between 9am-1pm and to call Friday prior to surgery for arrival time to SDS. Advised pt to be npo after mn day of surgery. Pt voices understanding.

## 2015-12-13 LAB — CULTURE, URINE COMPREHENSIVE

## 2015-12-15 ENCOUNTER — Telehealth: Payer: Self-pay | Admitting: Urology

## 2015-12-15 MED ORDER — CIPROFLOXACIN HCL 500 MG PO TABS: 500.0000 mg | ORAL_TABLET | Freq: Two times a day (BID) | ORAL | Status: DC

## 2015-12-15 NOTE — Telephone Encounter (Signed)
Please treat with Cipro 500 mg bid x 7 days.  Low colony count enterococcus.  Hollice Espy, MD

## 2015-12-18 NOTE — Telephone Encounter (Signed)
Pt made aware of abx. Pt voiced understanding.

## 2015-12-28 ENCOUNTER — Encounter: Payer: Self-pay | Admitting: *Deleted

## 2015-12-28 ENCOUNTER — Inpatient Hospital Stay: Admission: RE | Admit: 2015-12-28 | Payer: BLUE CROSS/BLUE SHIELD | Source: Ambulatory Visit

## 2016-01-01 NOTE — Pre-Procedure Instructions (Signed)
CALLED AMY AT DR Audree Bane OFFICE AND INFORMED HER THAT PT HAS BEEN CALLED 4 X AND HAS NEVER RETURNED THE CALLS BACK FOR HIS INTERVIEW. WILL HAVE TO PREOP ON MONDAY

## 2016-01-04 ENCOUNTER — Encounter: Payer: Self-pay | Admitting: *Deleted

## 2016-01-04 ENCOUNTER — Ambulatory Visit: Payer: BLUE CROSS/BLUE SHIELD | Admitting: Certified Registered Nurse Anesthetist

## 2016-01-04 ENCOUNTER — Ambulatory Visit
Admission: RE | Admit: 2016-01-04 | Discharge: 2016-01-04 | Disposition: A | Discharge status: Home / Self Care | Payer: BLUE CROSS/BLUE SHIELD | Source: Ambulatory Visit | Attending: Urology | Admitting: Urology

## 2016-01-04 ENCOUNTER — Encounter
Admission: RE | Disposition: A | Discharge status: Home / Self Care | Payer: Self-pay | Source: Ambulatory Visit | Attending: Urology

## 2016-01-04 DIAGNOSIS — R110 Nausea: Secondary | ICD-10-CM | POA: Diagnosis not present

## 2016-01-04 DIAGNOSIS — R109 Unspecified abdominal pain: Secondary | ICD-10-CM | POA: Insufficient documentation

## 2016-01-04 DIAGNOSIS — N200 Calculus of kidney: Secondary | ICD-10-CM

## 2016-01-04 DIAGNOSIS — Z87442 Personal history of urinary calculi: Secondary | ICD-10-CM | POA: Diagnosis not present

## 2016-01-04 DIAGNOSIS — Z79899 Other long term (current) drug therapy: Secondary | ICD-10-CM | POA: Diagnosis not present

## 2016-01-04 DIAGNOSIS — N179 Acute kidney failure, unspecified: Secondary | ICD-10-CM | POA: Insufficient documentation

## 2016-01-04 DIAGNOSIS — N202 Calculus of kidney with calculus of ureter: Secondary | ICD-10-CM | POA: Insufficient documentation

## 2016-01-04 HISTORY — PX: CYSTOSCOPY W/ URETERAL STENT PLACEMENT: SHX1429

## 2016-01-04 HISTORY — PX: URETEROSCOPY WITH HOLMIUM LASER LITHOTRIPSY: SHX6645

## 2016-01-04 SURGERY — URETEROSCOPY, WITH LITHOTRIPSY USING HOLMIUM LASER
Anesthesia: General | Laterality: Left | Wound class: Clean Contaminated

## 2016-01-04 MED ORDER — ROCURONIUM BROMIDE 100 MG/10ML IV SOLN
INTRAVENOUS | Status: DC | PRN
  Administered 2016-01-04 (×3): 10 mg via INTRAVENOUS
  Administered 2016-01-04: 25 mg via INTRAVENOUS
  Administered 2016-01-04 (×2): 10 mg via INTRAVENOUS
  Administered 2016-01-04: 5 mg via INTRAVENOUS
  Administered 2016-01-04: 10 mg via INTRAVENOUS

## 2016-01-04 MED ORDER — ONDANSETRON HCL 4 MG/2ML IJ SOLN
INTRAMUSCULAR | Status: DC | PRN
  Administered 2016-01-04: 4 mg via INTRAVENOUS

## 2016-01-04 MED ORDER — PROPOFOL 10 MG/ML IV BOLUS
INTRAVENOUS | Status: DC | PRN
  Administered 2016-01-04: 50 mg via INTRAVENOUS
  Administered 2016-01-04: 200 mg via INTRAVENOUS
  Administered 2016-01-04: 50 mg via INTRAVENOUS

## 2016-01-04 MED ORDER — GENTAMICIN IN SALINE 1.6-0.9 MG/ML-% IV SOLN
80.0000 mg | Freq: Once | INTRAVENOUS | Status: AC
  Administered 2016-01-04: 80 mg via INTRAVENOUS
  Filled 2016-01-04: qty 50

## 2016-01-04 MED ORDER — FENTANYL CITRATE (PF) 100 MCG/2ML IJ SOLN
INTRAMUSCULAR | Status: AC
  Administered 2016-01-04: 25 ug via INTRAVENOUS
  Filled 2016-01-04: qty 2

## 2016-01-04 MED ORDER — DEXAMETHASONE SODIUM PHOSPHATE 10 MG/ML IJ SOLN
INTRAMUSCULAR | Status: DC | PRN
  Administered 2016-01-04: 5 mg via INTRAVENOUS

## 2016-01-04 MED ORDER — NEOSTIGMINE METHYLSULFATE 10 MG/10ML IV SOLN
INTRAVENOUS | Status: DC | PRN
  Administered 2016-01-04: 3 mg via INTRAVENOUS

## 2016-01-04 MED ORDER — MIDAZOLAM HCL 2 MG/2ML IJ SOLN
INTRAMUSCULAR | Status: DC | PRN
  Administered 2016-01-04: 2 mg via INTRAVENOUS

## 2016-01-04 MED ORDER — OXYCODONE-ACETAMINOPHEN 5-325 MG PO TABS: 1.0000 | ORAL_TABLET | ORAL | Status: DC | PRN

## 2016-01-04 MED ORDER — PHENYLEPHRINE HCL 10 MG/ML IJ SOLN
INTRAMUSCULAR | Status: DC | PRN
Start: 2016-01-04 — End: 2016-01-04
  Administered 2016-01-04 (×2): 50 ug via INTRAVENOUS
  Administered 2016-01-04: 100 ug via INTRAVENOUS
  Administered 2016-01-04 (×2): 50 ug via INTRAVENOUS
  Administered 2016-01-04: 100 ug via INTRAVENOUS
  Administered 2016-01-04: 50 ug via INTRAVENOUS
  Administered 2016-01-04: 100 ug via INTRAVENOUS

## 2016-01-04 MED ORDER — LACTATED RINGERS IV SOLN
INTRAVENOUS | Status: DC
  Administered 2016-01-04 (×3): via INTRAVENOUS

## 2016-01-04 MED ORDER — SUCCINYLCHOLINE CHLORIDE 20 MG/ML IJ SOLN
INTRAMUSCULAR | Status: DC | PRN
  Administered 2016-01-04: 120 mg via INTRAVENOUS

## 2016-01-04 MED ORDER — LIDOCAINE HCL (CARDIAC) 20 MG/ML IV SOLN
INTRAVENOUS | Status: DC | PRN
  Administered 2016-01-04: 20 mg via INTRAVENOUS

## 2016-01-04 MED ORDER — GENTAMICIN SULFATE 40 MG/ML IJ SOLN
80.0000 mg | Freq: Once | INTRAVENOUS | Status: DC
  Filled 2016-01-04: qty 2

## 2016-01-04 MED ORDER — PROMETHAZINE HCL 25 MG/ML IJ SOLN: 6.2500 mg | INTRAMUSCULAR | Status: DC | PRN

## 2016-01-04 MED ORDER — FENTANYL CITRATE (PF) 100 MCG/2ML IJ SOLN
INTRAMUSCULAR | Status: DC | PRN
  Administered 2016-01-04 (×2): 50 ug via INTRAVENOUS
  Administered 2016-01-04: 100 ug via INTRAVENOUS

## 2016-01-04 MED ORDER — FENTANYL CITRATE (PF) 100 MCG/2ML IJ SOLN
25.0000 ug | INTRAMUSCULAR | Status: DC | PRN
  Administered 2016-01-04 (×4): 25 ug via INTRAVENOUS

## 2016-01-04 MED ORDER — GLYCOPYRROLATE 0.2 MG/ML IJ SOLN
INTRAMUSCULAR | Status: DC | PRN
  Administered 2016-01-04: 0.1 mg via INTRAVENOUS
  Administered 2016-01-04: 0.4 mg via INTRAVENOUS

## 2016-01-04 MED ORDER — SODIUM CHLORIDE 0.9 % IV SOLN
INTRAVENOUS | Status: AC
  Administered 2016-01-04: 1 g via INTRAVENOUS
  Filled 2016-01-04: qty 1000

## 2016-01-04 MED ORDER — EPHEDRINE SULFATE 50 MG/ML IJ SOLN
INTRAMUSCULAR | Status: DC | PRN
  Administered 2016-01-04 (×2): 10 mg via INTRAVENOUS

## 2016-01-04 MED ORDER — SODIUM CHLORIDE 0.9 % IV SOLN
1.0000 g | Freq: Once | INTRAVENOUS | Status: AC
  Administered 2016-01-04: 1 g via INTRAVENOUS

## 2016-01-04 SURGICAL SUPPLY — 35 items
ADAPTER SCOPE UROLOK II (MISCELLANEOUS) ×4 IMPLANT
ADPR INSRT BALL FIT URLK2 (MISCELLANEOUS) ×2
BAG DRAIN CYSTO-URO LG1000N (MISCELLANEOUS) ×4 IMPLANT
BASKET ZERO TIP 1.9FR (BASKET) ×6 IMPLANT
BSKT STON RTRVL ZERO TP 1.9FR (BASKET) ×4
CATH URETL 5X70 OPEN END (CATHETERS) ×4 IMPLANT
CNTNR SPEC 2.5X3XGRAD LEK (MISCELLANEOUS) ×2
CONRAY 43 FOR UROLOGY 50M (MISCELLANEOUS) ×4 IMPLANT
CONT SPEC 4OZ STER OR WHT (MISCELLANEOUS) ×2
CONT SPEC 4OZ STRL OR WHT (MISCELLANEOUS) ×2
CONTAINER SPEC 2.5X3XGRAD LEK (MISCELLANEOUS) ×2 IMPLANT
GLOVE BIO SURGEON STRL SZ 6.5 (GLOVE) ×3 IMPLANT
GLOVE BIO SURGEON STRL SZ7 (GLOVE) ×8 IMPLANT
GLOVE BIO SURGEONS STRL SZ 6.5 (GLOVE) ×1
GOWN STRL REUS W/ TWL LRG LVL4 (GOWN DISPOSABLE) ×4 IMPLANT
GOWN STRL REUS W/TWL LRG LVL4 (GOWN DISPOSABLE) ×8
GUIDEWIRE GREEN .038 145CM (MISCELLANEOUS) ×2 IMPLANT
INTRODUCER DILATOR DOUBLE (INTRODUCER) ×4 IMPLANT
KIT RM TURNOVER CYSTO AR (KITS) ×4 IMPLANT
LASER FIBER 200M SMARTSCOPE (Laser) ×4 IMPLANT
PACK CYSTO AR (MISCELLANEOUS) ×4 IMPLANT
PREP PVP WINGED SPONGE (MISCELLANEOUS) ×4 IMPLANT
PUMP SINGLE ACTION SAP (PUMP) ×4 IMPLANT
SENSORWIRE 0.038 NOT ANGLED (WIRE) ×8
SET CYSTO W/LG BORE CLAMP LF (SET/KITS/TRAYS/PACK) ×4 IMPLANT
SHEATH URETERAL 12FRX35CM (MISCELLANEOUS) ×4 IMPLANT
SOL .9 NS 3000ML IRR  AL (IV SOLUTION) ×4
SOL .9 NS 3000ML IRR AL (IV SOLUTION) ×4
SOL .9 NS 3000ML IRR UROMATIC (IV SOLUTION) ×2 IMPLANT
STENT URET 6FRX24 CONTOUR (STENTS) ×2 IMPLANT
STENT URET 6FRX26 CONTOUR (STENTS) ×6 IMPLANT
SURGILUBE 2OZ TUBE FLIPTOP (MISCELLANEOUS) ×4 IMPLANT
SYRINGE IRR TOOMEY STRL 70CC (SYRINGE) ×4 IMPLANT
WATER STERILE IRR 1000ML POUR (IV SOLUTION) ×4 IMPLANT
WIRE SENSOR 0.038 NOT ANGLED (WIRE) ×4 IMPLANT

## 2016-01-04 NOTE — Anesthesia Preprocedure Evaluation (Signed)
Anesthesia Evaluation  Patient identified by MRN, date of birth, ID band Patient awake    Reviewed: Allergy & Precautions, H&P , NPO status , Patient's Chart, lab work & pertinent test results, reviewed documented beta blocker date and time   History of Anesthesia Complications Negative for: history of anesthetic complications  Airway Mallampati: II  TM Distance: >3 FB Neck ROM: full    Dental no notable dental hx. (+) Teeth Intact   Pulmonary neg pulmonary ROS,    Pulmonary exam normal        Cardiovascular Exercise Tolerance: Good negative cardio ROS Normal cardiovascular exam Rate:Normal     Neuro/Psych negative neurological ROS  negative psych ROS   GI/Hepatic negative GI ROS, Neg liver ROS,   Endo/Other  negative endocrine ROS  Renal/GU Renal disease (kidney stones)  negative genitourinary   Musculoskeletal   Abdominal   Peds  Hematology negative hematology ROS (+)   Anesthesia Other Findings   Reproductive/Obstetrics negative OB ROS                             Anesthesia Physical  Anesthesia Plan  ASA: II  Anesthesia Plan: General ETT   Post-op Pain Management:    Induction:   Airway Management Planned:   Additional Equipment:   Intra-op Plan:   Post-operative Plan:   Informed Consent: I have reviewed the patients History and Physical, chart, labs and discussed the procedure including the risks, benefits and alternatives for the proposed anesthesia with the patient or authorized representative who has indicated his/her understanding and acceptance.     Plan Discussed with: CRNA, Anesthesiologist and Surgeon  Anesthesia Plan Comments:         Anesthesia Quick Evaluation

## 2016-01-04 NOTE — Op Note (Signed)
Date of procedure: 01/04/2016  Preoperative diagnosis:  1. Bilateral nephrolithiasis   Postoperative diagnosis:  1. Bilateral nephrolithiasis   Procedure: 1. Left ureteroscopy 2. Laser lithotripsy 3. Basket extraction of ureteral stone 4. Bilateral ureteral stent exchange  Surgeon: Hollice Espy, MD  Anesthesia: General  Complications: None  Intraoperative findings: Significant left-sided stone burden with large stone in each calyx.  Few small left distal ureteral fragments also extracted.  EBL: Minimal  Specimens: stone fragment  Drains: 6 x 26 double-J ureteral stent bilaterally  Indication: GLADE SAN is a 45 y.o. patient with significant bilateral nephrolithiasis who initially presented with bilateral partially obstructing ureteral stones. He previously underwent bilateral ureteral stent placement and treatment of his fairly significant sized left distal ureteral stone. He returns today to the operating room to address his left-sided stones, possibly his right depending on how the left side goes.  After reviewing the management options for treatment, he elected to proceed with the above surgical procedure(s). We have discussed the potential benefits and risks of the procedure, side effects of the proposed treatment, the likelihood of the patient achieving the goals of the procedure, and any potential problems that might occur during the procedure or recuperation. Informed consent has been obtained.  Description of procedure:  The patient was taken to the operating room and general anesthesia was induced.  The patient was placed in the dorsal lithotomy position, prepped and draped in the usual sterile fashion, and preoperative antibiotics were administered. A preoperative time-out was performed.   At this point in time, a rigid 21 French cystoscope was advanced per urethra into the bladder. The distal coil of the left ureteral stent was seen emanating from the left UO. The  distal coil was grasped and brought to the level of the urethral meatus. This is then cannulated using a sensor wire up to level of the kidney and the stent was removed leaving the wire place. A dual lumen access sheath was advanced just within the UO in order to introduce a second Super Stiff working wire. The sensor wire was snapped in place as a safety wire. A Cook ureteral access sheath was then advanced over the Super Stiff wire up to level of the proximal ureter and the inner cannula was removed. A flexible ureteroscope was then brought in up to level of the renal pelvis through the access sheath and formal pyeloscopy was performed. On fluoroscopy, numerous stones could be seen bilaterally in each of the upper pole, mid pole, and lower pole calyces. These were also identified on direct visualization of the calyces. A 200  laser fiber was then brought in and using the settings of 0.2 Hz and 53 J, I began to laser each of the stones into small sand-like particles starting at the upper pole calyces working my way down. The stones are very very hard and took a significant amount of time to fragment. Some of the difficult lower pole stones were basketed and repositioned into the upper pole calyces in order to treat. I lasered for approximately 2.5 hours at which time I was able to clear the majority of the left sided stones. There was one calyx in the anterior extremely lower pole which was very difficult to access and I was unable to basket the stone from this calyx. Unfortunately, I was unable to treat this one particular stone today but did treat approximately 8 or 9 other fairly large stones. At this point in time, this was the only remaining significant stone  within the left collecting system. A few larger fragments were basket extracted out and sent for stone analysis. The scope was then removed under direct visualization at which time 2 fairly significant size fragments were identified within the very  distal left ureter. One was basket extracted using the flexible ureteroscope and a second was basket extracted using a semirigid ureteroscope. A 6 x 26 French double-J ureteral stent was then introduced over the safety wire with a coil within the renal pelvis and within the bladder itself.  Attention was then turned to the right side from which a ureteral stent was seen emanating. The distal coil was grasped and brought up to level of the urethral meatus. The stent was then cannulated using a sensor wire which was placed all the way up to level of the kidney. The stent was then removed leaving the wire in place. The wire was then backloaded over a rigid cystoscope and the stent was replaced with a new 6 x 26 French double-J ureteral stent. Coil was noted within the renal pelvis once the wire was removed as well as within the bladder.  The stent was exchanged for the purpose of avoiding encrustation and obstruction of this right-sided stent. The patient was then repositioned the supine position, reversed from anesthesia, and taken to the PACU in stable condition.  Plan: Patient will follow-up in 1 week for cystoscopy and left ureteral stent removal. We will consider observing this persistent small left lower pole stone versus ESWL. Given significant stone burden on the right, we will discuss planning right PCNL to treat this side as well as the proximal ureteral stone.  Hollice Espy, M.D.

## 2016-01-04 NOTE — Interval H&P Note (Signed)
History and Physical Interval Note:  01/04/2016 10:14 AM  Christian Pace  has presented today for surgery, with the diagnosis of BILATERAL NEPHROLITHIASIS  The various methods of treatment have been discussed with the patient and family. After consideration of risks, benefits and other options for treatment, the patient has consented to  Procedure(s): URETEROSCOPY WITH HOLMIUM LASER LITHOTRIPSY (Bilateral) CYSTOSCOPY WITH STENT REPLACEMENT (Bilateral) as a surgical intervention .  The patient's history has been reviewed, patient examined, no change in status, stable for surgery.  I have reviewed the patient's chart and labs.  Questions were answered to the patient's satisfaction.    Plan for starting on left side, possibly right depending on progress.    Hollice Espy

## 2016-01-04 NOTE — Transfer of Care (Signed)
Immediate Anesthesia Transfer of Care Note  Patient: Christian Pace  Procedure(s) Performed: Procedure(s): URETEROSCOPY WITH HOLMIUM LASER LITHOTRIPSY (Left) CYSTOSCOPY WITH STENT REPLACEMENT (Bilateral)  Patient Location: PACU  Anesthesia Type:General  Level of Consciousness: awake  Airway & Oxygen Therapy: Patient connected to face mask oxygen  Post-op Assessment: Report given to RN  Post vital signs: stable  Last Vitals:  Filed Vitals:   01/04/16 1008 01/04/16 1355  BP: 132/82 117/65  Pulse: 92 98  Temp: 37.1 C 36.1 C  Resp: 20 12    Complications: No apparent anesthesia complications

## 2016-01-04 NOTE — Discharge Instructions (Signed)
You have a ureteral stent in place.  This is a tube that extends from your kidney to your bladder.  This may cause urinary bleeding, burning with urination, and urinary frequency.  Please call our office or present to the ED if you develop fevers >101 or pain which is not able to be controlled with oral pain medications.  You may be given either Flomax and/ or ditropan to help with bladder spasms and stent pain in addition to pain medications.   ° °[facility] °[address], [address] °, [place] [address] °[phone]

## 2016-01-04 NOTE — H&P (View-Only) (Signed)
4:54 PM  12/09/2015   Christian Pace [facility] 28-Apr-1971 [id number]  Referring provider: No referring provider defined for this encounter.  Chief Complaint  Patient presents with  . Nephrolithiasis  . Pre-op Exam    HPI: I69 year old male with a history of kidney stones presenting today for ER follow up after being seen for acute onset of right flank pain on 11/27/15. CT renal stone study was performed noting a 10 mm calculus at the right UPJ as well as an 8 mm calculus at the left UVJ. Many moderate to large nonobstructing renal stones also seen bilaterally. No fevers or gross hematuria.   He was taken urgently to the operating room on 12/01/2014 for bilateral ureteral stent placement, and treatment of his left distal ureteral stone with laser lithotripsy.  Preop labs showed worsening renal function, creatinine 2.1 for the time of the procedure one week ago.  It appears that his baseline is likely 1.33 from 2 years ago.   History of renal stones x 20 years. No previous stone interventions.  Reports that he thinks he has passed approximately 6 stones spontaneously.  Return to the office today for preop culture as well as to discuss treatment challenging for his innumerable bilateral stones.  PMH: Past Medical History  Diagnosis Date  . Renal disorder   . Kidney stones     Surgical History: Past Surgical History  Procedure Laterality Date  . Hernia repair    . Ureteroscopy with holmium laser lithotripsy Left 12/02/2015    Procedure: URETEROSCOPY WITH HOLMIUM LASER LITHOTRIPSY WITH RETROGRADE PYLOGRAM;  Surgeon: Hollice Espy, MD;  Location: [facility] ORS;  Service: Urology;  Laterality: Left;  . Cystoscopy with stent placement Bilateral 12/02/2015    Procedure: CYSTOSCOPY WITH STENT PLACEMENT,bilateral retrograde pylorograms;  Surgeon: Hollice Espy, MD;  Location: [facility] ORS;  Service: Urology;  Laterality: Bilateral;    Home Medications:    Medication List       This list is  accurate as of: 12/09/15  4:54 PM.  Always use your most recent med list.               HYDROmorphone 2 MG tablet  Commonly known as:  DILAUDID  Take 1 tablet (2 mg total) by mouth every 12 (twelve) hours as needed for severe pain.     ondansetron 4 MG tablet  Commonly known as:  ZOFRAN  Take 1 tablet (4 mg total) by mouth every 8 (eight) hours as needed for nausea or vomiting.     oxybutynin 5 MG tablet  Commonly known as:  DITROPAN  Take 1 tablet (5 mg total) by mouth every 8 (eight) hours as needed for bladder spasms.     oxyCODONE-acetaminophen 5-325 MG tablet  Commonly known as:  PERCOCET  Take 1-2 tablets by mouth every 4 (four) hours as needed for moderate pain or severe pain.     phenazopyridine 200 MG tablet  Commonly known as:  PYRIDIUM  Take 1 tablet (200 mg total) by mouth 3 (three) times daily as needed for pain.     tamsulosin 0.4 MG Caps capsule  Commonly known as:  FLOMAX  Take 1 capsule (0.4 mg total) by mouth daily.        Allergies: No Known Allergies  Family History: History reviewed. No pertinent family history.  Social History:  reports that he has never smoked. He does not have any smokeless tobacco history on file. He reports that he drinks alcohol. He reports that he does not use illicit  drugs.  ROS: UROLOGY Frequent Urination?: Yes Hard to postpone urination?: No Burning/pain with urination?: Yes Get up at night to urinate?: No Leakage of urine?: No Urine stream starts and stops?: No Trouble starting stream?: No Do you have to strain to urinate?: No Blood in urine?: No Urinary tract infection?: No Sexually transmitted disease?: No Injury to kidneys or bladder?: No Painful intercourse?: No Weak stream?: No Erection problems?: No Penile pain?: No  Gastrointestinal Nausea?: No Vomiting?: No Indigestion/heartburn?: No Diarrhea?: No Constipation?: No  Constitutional Fever: No Night sweats?: No Weight loss?: No Fatigue?:  No  Skin Skin rash/lesions?: No Itching?: No  Eyes Blurred vision?: No Double vision?: No  Ears/Nose/Throat Sore throat?: No Sinus problems?: No  Hematologic/Lymphatic Swollen glands?: No Easy bruising?: No  Cardiovascular Leg swelling?: No Chest pain?: No  Respiratory Cough?: No Shortness of breath?: No  Endocrine Excessive thirst?: No  Musculoskeletal Back pain?: No Joint pain?: No  Neurological Headaches?: No Dizziness?: No  Psychologic Depression?: No Anxiety?: No  Physical Exam: BP 155/81 mmHg  Pulse 90  Ht 6' (1.829 m)  Wt 196 lb 12.8 oz (89.268 kg)  BMI 26.69 kg/m2  Constitutional:  Alert and oriented, No acute distress. HEENT: [place] AT, moist mucus membranes.  Trachea midline, no masses.  Left erosion of the year. Cardiovascular: No clubbing, cyanosis, or edema, RRR Respiratory: Normal respiratory effort, no increased work of breathing, CTAB GI: Abdomen is soft, nondistended, no abdominal masses GU: No CVA tenderness. Skin: No rashes, bruises or suspicious lesions. Neurologic: Grossly intact, no focal deficits, moving all 4 extremities. Psychiatric: Normal mood and affect.  Laboratory Data:   Urinalysis Results for orders placed or performed in visit on 12/09/15  Microscopic Examination  Result Value Ref Range   WBC, UA 6-10 (A) 0 -  5 /hpf   RBC, UA >30 (H) 0 -  2 /hpf   Epithelial Cells (non renal) 0-10 0 - 10 /hpf   Mucus, UA Present (A) Not Estab.   Bacteria, UA Few (A) None seen/Few  Urinalysis, Complete  Result Value Ref Range   Specific Gravity, UA 1.015 1.005 - 1.030   pH, UA 6.5 5.0 - 7.5   Color, UA Yellow Yellow   Appearance Ur Cloudy (A) Clear   Leukocytes, UA 2+ (A) Negative   Protein, UA 1+ (A) Negative/Trace   Glucose, UA Negative Negative   Ketones, UA Negative Negative   RBC, UA 3+ (A) Negative   Bilirubin, UA Negative Negative   Urobilinogen, Ur 0.2 0.2 - 1.0 mg/dL   Nitrite, UA Negative Negative   Microscopic  Examination See below:     Pertinent Imaging: CLINICAL DATA: 45 year old male with right-sided flank pain since 4 a.m. Nausea. Prior history of kidney stones. History of bilateral inguinal hernia repair.  EXAM: CT ABDOMEN AND PELVIS WITHOUT CONTRAST  TECHNIQUE: Multidetector CT imaging of the abdomen and pelvis was performed following the standard protocol without IV contrast.  COMPARISON: CT of the abdomen and pelvis 05/04/2013.  FINDINGS: Lower chest: Unremarkable.  Hepatobiliary: No definite cystic or solid hepatic lesions are identified on today's noncontrast CT examination. Unenhanced appearance of the gallbladder is normal.  Pancreas: No pancreatic mass or peripancreatic inflammatory changes on today's noncontrast CT examination.  Spleen: Unremarkable.  Adrenals/Urinary Tract: There are numerous nonobstructive calculi in the collecting systems of the kidneys bilaterally measuring up to 1.3 cm in the upper pole of the left kidney. In addition, there is a 10 mm calculus at the right ureteropelvic junction. There  is also an 8 mm calculus at the left ureterovesicular junction. Neither of these are associated with significant proximal hydroureteronephrosis, although there is some very mild fullness in the right renal collecting system. No bladder calculi. Bilateral adrenal glands are normal in appearance.  Stomach/Bowel: Normal appearance of the stomach. No pathologic dilatation of small bowel or colon. Normal appendix.  Vascular/Lymphatic: No significant atherosclerotic calcifications or definite aneurysm identified in the abdominal or pelvic vasculature. No lymphadenopathy noted in the abdomen or pelvis on today's noncontrast CT examination.  Reproductive: Prostate gland and seminal vesicles are unremarkable in appearance.  Other: Postoperative changes of bilateral inguinal herniorrhaphy are noted. No significant volume of ascites. No  pneumoperitoneum.  Musculoskeletal: There are no aggressive appearing lytic or blastic lesions noted in the visualized portions of the skeleton.  IMPRESSION: 1. In addition to numerous nonobstructive calculi in the collecting systems of the kidneys bilaterally, the patient has a 10 mm calculus at the right ureteropelvic junction, and a 8 mm calculus at the left ureterovesicular junction. At this time, there is only mild fullness in the right renal collecting system, which could suggest very mild obstruction on the right side, but there is no associated left-sided hydroureteronephrosis. 2. Normal appendix. 3. Status post bilateral inguinal herniorrhaphy. Electronically Signed  By: Vinnie Langton M.D.  On: 11/27/2015 08:30  Assessment & Plan:    1. Nephrolithiasis Significant stone burden bilaterally. CT imaging reviewed again today with the patient. Plan for staged ureteroscopy to clear him on his left side with a lesser stone burden. We discussed consideration for possible right PCNL in the future in order to optimize his stone clearance with the fewest number of procedures. We'll go ahead and plan for proceeding with left ureteroscopy, possible right ureteroscopy in the next few weeks.  Reviewed risk and benefits of the procedure. He is willing to proceed as planned.  - Urinalysis, Complete - CULTURE, URINE COMPREHENSIVE - Basic metabolic panel  2. Acute renal failure, unspecified acute renal failure type ([place]) Recheck BMP today, bilateral stents now placed. -- Basic metabolic panel  3. 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 Does not have the PCP. Request dermatology referral today. - Ambulatory referral to Dermatology   Hollice Espy, MD  [facility] [address], [place], [place] [address] [phone]

## 2016-01-04 NOTE — Anesthesia Procedure Notes (Signed)
Procedure Name: Intubation Date/Time: 01/04/2016 10:44 AM Performed by: Naomie Dean Pre-anesthesia Checklist: Patient identified, Emergency Drugs available, Suction available, Patient being monitored and Timeout performed Patient Re-evaluated:Patient Re-evaluated prior to inductionOxygen Delivery Method: Circle system utilized Preoxygenation: Pre-oxygenation with 100% oxygen Intubation Type: IV induction and Cricoid Pressure applied Laryngoscope Size: Mac and 3 Grade View: Grade III Tube type: Oral Tube size: 7.5 mm Number of attempts: 1 Placement Confirmation: ETT inserted through vocal cords under direct vision,  positive ETCO2,  CO2 detector and breath sounds checked- equal and bilateral Secured at: 23 cm Tube secured with: Tape Dental Injury: Teeth and Oropharynx as per pre-operative assessment  Comments: Enlongated epiglottis

## 2016-01-06 NOTE — Anesthesia Postprocedure Evaluation (Signed)
Anesthesia Post Note  Patient: Christian Pace  Procedure(s) Performed: Procedure(s) (LRB): URETEROSCOPY WITH HOLMIUM LASER LITHOTRIPSY (Left) CYSTOSCOPY WITH STENT REPLACEMENT (Bilateral)  Patient location during evaluation: PACU Anesthesia Type: General Level of consciousness: awake and alert Pain management: pain level controlled Vital Signs Assessment: post-procedure vital signs reviewed and stable Respiratory status: spontaneous breathing, nonlabored ventilation, respiratory function stable and patient connected to nasal cannula oxygen Cardiovascular status: blood pressure returned to baseline and stable Postop Assessment: no signs of nausea or vomiting Anesthetic complications: no    Last Vitals:  Filed Vitals:   01/04/16 1451 01/04/16 1500  BP: 109/63 118/76  Pulse: 91 100  Temp: 36.6 C   Resp: 14     Last Pain:  Filed Vitals:   01/05/16 0839  PainSc: 0-No pain                 Martha Clan

## 2016-01-12 LAB — STONE ANALYSIS
CA OXALATE, MONOHYDR.: 90 %
CA PHOS CRY STONE QL IR: 10 %
STONE WEIGHT KSTONE: 150 mg

## 2016-01-13 ENCOUNTER — Other Ambulatory Visit: Payer: Self-pay | Admitting: Urology

## 2016-01-13 ENCOUNTER — Encounter: Payer: Self-pay | Admitting: Urology

## 2016-01-13 ENCOUNTER — Ambulatory Visit (INDEPENDENT_AMBULATORY_CARE_PROVIDER_SITE_OTHER): Payer: BLUE CROSS/BLUE SHIELD | Admitting: Urology

## 2016-01-13 VITALS — BP 132/80 | HR 82 | Ht 72.0 in | Wt 194.9 lb

## 2016-01-13 DIAGNOSIS — N179 Acute kidney failure, unspecified: Secondary | ICD-10-CM

## 2016-01-13 DIAGNOSIS — N200 Calculus of kidney: Secondary | ICD-10-CM | POA: Diagnosis not present

## 2016-01-13 LAB — URINALYSIS, COMPLETE
BILIRUBIN UA: NEGATIVE
Glucose, UA: NEGATIVE
Ketones, UA: NEGATIVE
Nitrite, UA: NEGATIVE
PH UA: 6.5 (ref 5.0–7.5)
Specific Gravity, UA: 1.015 (ref 1.005–1.030)
UUROB: 0.2 mg/dL (ref 0.2–1.0)

## 2016-01-13 LAB — MICROSCOPIC EXAMINATION: EPITHELIAL CELLS (NON RENAL): NONE SEEN /HPF (ref 0–10)

## 2016-01-14 ENCOUNTER — Telehealth: Payer: Self-pay | Admitting: Radiology

## 2016-01-14 ENCOUNTER — Other Ambulatory Visit: Payer: Self-pay | Admitting: Urology

## 2016-01-14 DIAGNOSIS — N200 Calculus of kidney: Secondary | ICD-10-CM

## 2016-01-14 NOTE — Telephone Encounter (Signed)
Pt notified of surgery scheduled 02/10/16, pre-admit testing appt on 2/13 @7 :30 and to call day prior to surgery for arrival time to SDS. Pt voices understanding.

## 2016-01-24 ENCOUNTER — Encounter: Payer: Self-pay | Admitting: Urology

## 2016-01-24 NOTE — Progress Notes (Signed)
01/13/2016 3:16 PM   OLEGARIO DOCTOR 04/08/1971 [id number]  Referring provider: No referring provider defined for this encounter.  Chief Complaint  Patient presents with  . Cysto Stent Removal    HPI: 45 year old male with extensive bilateral nephrolithiasis who underwent staged left ureteroscopy for treatment of his left-sided stones. He returns today for office cystoscopy to remove his stent.  He continues to have a significant right-sided stone burden including a 10 mm calculus at the right UPJ as well as numerous other nonobstructing calculi on the right.  History of renal stones x 20 years. No previous stone interventions. Reports that he thinks he has passed approximately 6 stones spontaneously.  Stone analysis shows stone composition 90% calcium oxalate monohydrate, 10% calcium phosphate.  Initially on presentation, his BMP was noted to be upright with elevated creatinine to 2.41. This is since improved to 1.3 for which is his baseline.  He has no complaints other than irritation from the stent. No fevers or chills.  PMH: Past Medical History  Diagnosis Date  . Renal disorder   . Kidney stones     Surgical History: Past Surgical History  Procedure Laterality Date  . Hernia repair    . Ureteroscopy with holmium laser lithotripsy Left 12/02/2015    Procedure: URETEROSCOPY WITH HOLMIUM LASER LITHOTRIPSY WITH RETROGRADE PYLOGRAM;  Surgeon: Hollice Espy, MD;  Location: [facility] ORS;  Service: Urology;  Laterality: Left;  . Cystoscopy with stent placement Bilateral 12/02/2015    Procedure: CYSTOSCOPY WITH STENT PLACEMENT,bilateral retrograde pylorograms;  Surgeon: Hollice Espy, MD;  Location: [facility] ORS;  Service: Urology;  Laterality: Bilateral;  . Ureteroscopy with holmium laser lithotripsy Left 01/04/2016    Procedure: URETEROSCOPY WITH HOLMIUM LASER LITHOTRIPSY;  Surgeon: Hollice Espy, MD;  Location: [facility] ORS;  Service: Urology;  Laterality: Left;  . Cystoscopy w/  ureteral stent placement Bilateral 01/04/2016    Procedure: CYSTOSCOPY WITH STENT REPLACEMENT;  Surgeon: Hollice Espy, MD;  Location: [facility] ORS;  Service: Urology;  Laterality: Bilateral;    Home Medications:    Medication List       This list is accurate as of: 01/13/16 11:59 PM.  Always use your most recent med list.               ciprofloxacin 500 MG tablet  Commonly known as:  CIPRO  Take 1 tablet (500 mg total) by mouth every 12 (twelve) hours.     HYDROmorphone 2 MG tablet  Commonly known as:  DILAUDID  Take 1 tablet (2 mg total) by mouth every 12 (twelve) hours as needed for severe pain.     ondansetron 4 MG tablet  Commonly known as:  ZOFRAN  Take 1 tablet (4 mg total) by mouth every 8 (eight) hours as needed for nausea or vomiting.     oxybutynin 5 MG tablet  Commonly known as:  DITROPAN  Take 1 tablet (5 mg total) by mouth every 8 (eight) hours as needed for bladder spasms.     oxyCODONE-acetaminophen 5-325 MG tablet  Commonly known as:  PERCOCET  Take 1-2 tablets by mouth every 4 (four) hours as needed for moderate pain or severe pain.     phenazopyridine 200 MG tablet  Commonly known as:  PYRIDIUM  Take 1 tablet (200 mg total) by mouth 3 (three) times daily as needed for pain.     tamsulosin 0.4 MG Caps capsule  Commonly known as:  FLOMAX  Take 1 capsule (0.4 mg total) by mouth daily.  Allergies: No Known Allergies  Family History: No family history on file.  Social History:  reports that he has never smoked. He does not have any smokeless tobacco history on file. He reports that he drinks alcohol. He reports that he does not use illicit drugs.   Physical Exam: BP 132/80 mmHg  Pulse 82  Ht 6' (1.829 m)  Wt 194 lb 14.4 oz (88.406 kg)  BMI 26.43 kg/m2  Constitutional:  Alert and oriented, No acute distress. HEENT: [place] AT, moist mucus membranes.  Trachea midline, no masses. Cardiovascular: No clubbing, cyanosis, or edema.  RRR. Respiratory:  Normal respiratory effort, no increased work of breathing.  CTAB, GI: Abdomen is soft, nontender, nondistended, no abdominal masses GU: No CVA tenderness. Normal phallus with orthotopic patent meatus. Skin: No rashes, bruises or suspicious lesions.. Neurologic: Grossly intact, no focal deficits, moving all 4 extremities. Psychiatric: Normal mood and affect.  Laboratory Data: Lab Results  Component Value Date   WBC 10.8* 11/27/2015   HGB 15.4 11/27/2015   HCT 46.1 11/27/2015   MCV 88.5 11/27/2015   PLT 226 11/27/2015    Lab Results  Component Value Date   CREATININE 1.34* 12/09/2015   Urinalysis Results for orders placed or performed in visit on 01/13/16  Microscopic Examination  Result Value Ref Range   WBC, UA 0-5 0 -  5 /hpf   RBC, UA >30 (H) 0 -  2 /hpf   Epithelial Cells (non renal) None seen 0 - 10 /hpf   Mucus, UA Present (A) Not Estab.   Bacteria, UA Many (A) None seen/Few  Urinalysis, Complete  Result Value Ref Range   Specific Gravity, UA 1.015 1.005 - 1.030   pH, UA 6.5 5.0 - 7.5   Color, UA Yellow Yellow   Appearance Ur Cloudy (A) Clear   Leukocytes, UA 1+ (A) Negative   Protein, UA 2+ (A) Negative/Trace   Glucose, UA Negative Negative   Ketones, UA Negative Negative   RBC, UA 3+ (A) Negative   Bilirubin, UA Negative Negative   Urobilinogen, Ur 0.2 0.2 - 1.0 mg/dL   Nitrite, UA Negative Negative   Microscopic Examination See below:     Pertinent Imaging:  CLINICAL DATA: 45 year old male with right-sided flank pain since 4 a.m. Nausea. Prior history of kidney stones. History of bilateral inguinal hernia repair.  EXAM: CT ABDOMEN AND PELVIS WITHOUT CONTRAST  TECHNIQUE: Multidetector CT imaging of the abdomen and pelvis was performed following the standard protocol without IV contrast.  COMPARISON: CT of the abdomen and pelvis 05/04/2013.  FINDINGS: Lower chest: Unremarkable.  Hepatobiliary: No definite cystic or solid hepatic lesions  are identified on today's noncontrast CT examination. Unenhanced appearance of the gallbladder is normal.  Pancreas: No pancreatic mass or peripancreatic inflammatory changes on today's noncontrast CT examination.  Spleen: Unremarkable.  Adrenals/Urinary Tract: There are numerous nonobstructive calculi in the collecting systems of the kidneys bilaterally measuring up to 1.3 cm in the upper pole of the left kidney. In addition, there is a 10 mm calculus at the right ureteropelvic junction. There is also an 8 mm calculus at the left ureterovesicular junction. Neither of these are associated with significant proximal hydroureteronephrosis, although there is some very mild fullness in the right renal collecting system. No bladder calculi. Bilateral adrenal glands are normal in appearance.  Stomach/Bowel: Normal appearance of the stomach. No pathologic dilatation of small bowel or colon. Normal appendix.  Vascular/Lymphatic: No significant atherosclerotic calcifications or definite aneurysm identified in the abdominal or  pelvic vasculature. No lymphadenopathy noted in the abdomen or pelvis on today's noncontrast CT examination.  Reproductive: Prostate gland and seminal vesicles are unremarkable in appearance.  Other: Postoperative changes of bilateral inguinal herniorrhaphy are noted. No significant volume of ascites. No pneumoperitoneum.  Musculoskeletal: There are no aggressive appearing lytic or blastic lesions noted in the visualized portions of the skeleton.  IMPRESSION: 1. In addition to numerous nonobstructive calculi in the collecting systems of the kidneys bilaterally, the patient has a 10 mm calculus at the right ureteropelvic junction, and a 8 mm calculus at the left ureterovesicular junction. At this time, there is only mild fullness in the right renal collecting system, which could suggest very mild obstruction on the right side, but there is no associated  left-sided hydroureteronephrosis. 2. Normal appendix. 3. Status post bilateral inguinal herniorrhaphy.   Electronically Signed  By: Vinnie Langton M.D.  On: 11/27/2015 08:30       Cystoscopy/ Stent removal procedure  Patient identification was confirmed, informed consent was obtained, and patient was prepped using Betadine solution.  Lidocaine jelly was administered per urethral meatus.    Preoperative abx where received prior to procedure.    Procedure: - Flexible cystoscope introduced, without any difficulty.   - Thorough search of the bladder revealed:    normal urethral meatus  Stent seen emanating from both ureteral orifices.  The LEFT stent was grasped with stent graspers, and removed in entirety.  The RIGHT stent was left in place.  Post-Procedure: - Patient tolerated the procedure well   Assessment & Plan:    1. Nephrolithiasis S/p L URS (staged) and now left stent removal  Right ureteral stent remains in place with significant right proximal ureteral and nonobstucting stone burden.    Discussed options today for right sided stones.  Given the amount of stone material, recommend right PNCL for treatment of this stone as given the over al stone burden and hardness of stones, it will require the fewest procedures to achieve a stone free rate.  Risk of right PCNL (with likely antegrade URS) discussed at length including risk of bleeding, infection, damage to surround structures, and need for subsequent procedure.  Hospital course and post op care discussed as well.  He is agreeable with this plan.  - Urinalysis, Complete -preop labs/ repeat urine culture   Schedule R PNCL with IR placement of nephrostomy tube on morning of the procedure for posterior lower pole access.     Hollice Espy, MD  [facility] [address] [place], [place] [address] [phone]

## 2016-02-01 ENCOUNTER — Inpatient Hospital Stay: Admission: RE | Admit: 2016-02-01 | Payer: BLUE CROSS/BLUE SHIELD | Source: Ambulatory Visit

## 2016-02-02 ENCOUNTER — Telehealth: Payer: Self-pay | Admitting: Radiology

## 2016-02-02 NOTE — Telephone Encounter (Signed)
LMOM. Need to notify pt of surgery r/s info.

## 2016-02-03 NOTE — Telephone Encounter (Signed)
LMOM

## 2016-02-04 NOTE — Telephone Encounter (Signed)
LMOM

## 2016-02-09 NOTE — Telephone Encounter (Signed)
LMOM

## 2016-02-09 NOTE — Telephone Encounter (Signed)
Surgery and pre-admission testing information sheet mailed to pt. Surgery scheduled for 02/29/16, pre-admit testing appt on 02/18/16 and call Friday prior to surgery for arrival time to SDS.

## 2016-02-10 ENCOUNTER — Ambulatory Visit: Payer: BLUE CROSS/BLUE SHIELD

## 2016-02-11 ENCOUNTER — Encounter: Payer: Self-pay | Admitting: Radiology

## 2016-02-11 NOTE — Telephone Encounter (Signed)
LMOM

## 2016-02-11 NOTE — Telephone Encounter (Signed)
Surgery and pre-admission testing information sheet mailed to pt via certified mail. Surgery scheduled for 02/29/16, pre-admit testing appt on 02/18/16 and call Friday prior to surgery for arrival time to SDS.

## 2016-02-11 NOTE — Telephone Encounter (Signed)
Notified Achintya Terrero of surgery scheduled 02/29/16, pre-admit testing appt on 02/18/16 @9 :00 & to call Friday prior to surgery for arrival time to SDS. Also notified her of importance of surgery because the stent had been in place for over a month and it could cause life threatening complications or kidney damage & that a certified letter was mailed to pt. Christian Pace voices understanding & states she will tell pt to return my call.

## 2016-02-18 ENCOUNTER — Encounter
Admission: RE | Admit: 2016-02-18 | Discharge: 2016-02-18 | Disposition: A | Discharge status: Home / Self Care | Payer: BLUE CROSS/BLUE SHIELD | Source: Ambulatory Visit | Attending: Urology | Admitting: Urology

## 2016-02-18 DIAGNOSIS — Z01812 Encounter for preprocedural laboratory examination: Secondary | ICD-10-CM | POA: Diagnosis not present

## 2016-02-18 LAB — URINALYSIS COMPLETE WITH MICROSCOPIC (ARMC ONLY)
BACTERIA UA: NONE SEEN
Bilirubin Urine: NEGATIVE
Glucose, UA: NEGATIVE mg/dL
Ketones, ur: NEGATIVE mg/dL
Nitrite: NEGATIVE
PH: 6 (ref 5.0–8.0)
PROTEIN: 30 mg/dL — AB
SPECIFIC GRAVITY, URINE: 1.017 (ref 1.005–1.030)

## 2016-02-18 LAB — CBC
HEMATOCRIT: 46.2 % (ref 40.0–52.0)
HEMOGLOBIN: 15.3 g/dL (ref 13.0–18.0)
MCH: 28.3 pg (ref 26.0–34.0)
MCHC: 33.2 g/dL (ref 32.0–36.0)
MCV: 85.4 fL (ref 80.0–100.0)
Platelets: 272 10*3/uL (ref 150–440)
RBC: 5.41 MIL/uL (ref 4.40–5.90)
RDW: 14 % (ref 11.5–14.5)
WBC: 8.1 10*3/uL (ref 3.8–10.6)

## 2016-02-18 LAB — BASIC METABOLIC PANEL
ANION GAP: 7 (ref 5–15)
BUN: 15 mg/dL (ref 6–20)
CALCIUM: 9.2 mg/dL (ref 8.9–10.3)
CHLORIDE: 104 mmol/L (ref 101–111)
CO2: 30 mmol/L (ref 22–32)
Creatinine, Ser: 1.28 mg/dL — ABNORMAL HIGH (ref 0.61–1.24)
GFR calc Af Amer: >60 mL/min (ref >60)
GFR calc non Af Amer: >60 mL/min (ref >60)
GLUCOSE: 105 mg/dL — AB (ref 65–99)
POTASSIUM: 4.1 mmol/L (ref 3.5–5.1)
Sodium: 141 mmol/L (ref 135–145)

## 2016-02-18 NOTE — Patient Instructions (Signed)
  Your procedure is scheduled on: 02/29/16 Report to Day Surgery. To find out your arrival time please call [phone] between 1PM - 3PM on 02/26/16.  Remember: Instructions that are not followed completely may result in serious medical risk, up to and including death, or upon the discretion of your surgeon and anesthesiologist your surgery may need to be rescheduled.    __x__ 1. Do not eat food or drink liquids after midnight. No gum chewing or hard candies.     __x__ 2. No Alcohol for 24 hours before or after surgery.   ____ 3. Bring all medications with you on the day of surgery if instructed.    __x__ 4. Notify your doctor if there is any change in your medical condition     (cold, fever, infections).     Do not wear jewelry, make-up, hairpins, clips or nail polish.  Do not wear lotions, powders, or perfumes. You may wear deodorant.  Do not shave 48 hours prior to surgery. Men may shave face and neck.  Do not bring valuables to the hospital.    [facility] is not responsible for any belongings or valuables.               Contacts, dentures or bridgework may not be worn into surgery.  Leave your suitcase in the car. After surgery it may be brought to your room.  For patients admitted to the hospital, discharge time is determined by your                treatment team.   Patients discharged the day of surgery will not be allowed to drive home.   Please read over the following fact sheets that you were given:   Surgical Site Infection Prevention   ____ Take these medicines the morning of surgery with A SIP OF WATER:    1.   2.   3.   4.  5.  6.  ____ Fleet Enema (as directed)   _x___ Use CHG Soap as directed  ____ Use inhalers on the day of surgery  ____ Stop metformin 2 days prior to surgery    ____ Take 1/2 of usual insulin dose the night before surgery and none on the morning of surgery.   ____ Stop Coumadin/Plavix/aspirin on   ____ Stop Anti-inflammatories on   Tylenol only after 3/5   ____ Stop supplements until after surgery.    ____ Bring C-Pap to the hospital.

## 2016-02-20 LAB — URINE CULTURE
Culture: NO GROWTH
SPECIAL REQUESTS: NORMAL

## 2016-02-29 ENCOUNTER — Observation Stay
Admission: RE | Admit: 2016-02-29 | Discharge: 2016-03-01 | Disposition: A | Discharge status: Home / Self Care | Payer: BLUE CROSS/BLUE SHIELD | Source: Ambulatory Visit | Attending: Urology | Admitting: Urology

## 2016-02-29 ENCOUNTER — Inpatient Hospital Stay: Payer: BLUE CROSS/BLUE SHIELD | Admitting: Certified Registered"

## 2016-02-29 ENCOUNTER — Ambulatory Visit
Admission: RE | Admit: 2016-02-29 | Discharge: 2016-02-29 | Disposition: A | Discharge status: Home / Self Care | Payer: BLUE CROSS/BLUE SHIELD | Source: Ambulatory Visit | Attending: Urology | Admitting: Urology

## 2016-02-29 ENCOUNTER — Inpatient Hospital Stay: Payer: BLUE CROSS/BLUE SHIELD

## 2016-02-29 ENCOUNTER — Encounter
Admission: RE | Disposition: A | Discharge status: Home / Self Care | Payer: Self-pay | Source: Ambulatory Visit | Attending: Urology

## 2016-02-29 DIAGNOSIS — N200 Calculus of kidney: Secondary | ICD-10-CM

## 2016-02-29 DIAGNOSIS — Z23 Encounter for immunization: Secondary | ICD-10-CM | POA: Diagnosis not present

## 2016-02-29 DIAGNOSIS — Z419 Encounter for procedure for purposes other than remedying health state, unspecified: Secondary | ICD-10-CM

## 2016-02-29 DIAGNOSIS — Z9889 Other specified postprocedural states: Secondary | ICD-10-CM | POA: Diagnosis not present

## 2016-02-29 DIAGNOSIS — Z466 Encounter for fitting and adjustment of urinary device: Secondary | ICD-10-CM | POA: Insufficient documentation

## 2016-02-29 DIAGNOSIS — Z87442 Personal history of urinary calculi: Secondary | ICD-10-CM | POA: Insufficient documentation

## 2016-02-29 DIAGNOSIS — Z936 Other artificial openings of urinary tract status: Secondary | ICD-10-CM

## 2016-02-29 DIAGNOSIS — N1330 Unspecified hydronephrosis: Secondary | ICD-10-CM

## 2016-02-29 HISTORY — PX: NEPHROLITHOTOMY: SHX5134

## 2016-02-29 LAB — APTT: APTT: 26 s (ref 24–36)

## 2016-02-29 LAB — BASIC METABOLIC PANEL
Anion gap: 6 (ref 5–15)
BUN: 17 mg/dL (ref 6–20)
CALCIUM: 8.3 mg/dL — AB (ref 8.9–10.3)
CO2: 26 mmol/L (ref 22–32)
CREATININE: 1.45 mg/dL — AB (ref 0.61–1.24)
Chloride: 107 mmol/L (ref 101–111)
GFR calc Af Amer: >60 mL/min (ref >60)
GFR, EST NON AFRICAN AMERICAN: 58 mL/min — AB (ref >60)
Glucose, Bld: 128 mg/dL — ABNORMAL HIGH (ref 65–99)
Potassium: 4.2 mmol/L (ref 3.5–5.1)
SODIUM: 139 mmol/L (ref 135–145)

## 2016-02-29 LAB — HEMOGLOBIN AND HEMATOCRIT, BLOOD
HEMATOCRIT: 40.3 % (ref 40.0–52.0)
HEMOGLOBIN: 13.3 g/dL (ref 13.0–18.0)

## 2016-02-29 LAB — PROTIME-INR
INR: 1
Prothrombin Time: 13.4 seconds (ref 11.4–15.0)

## 2016-02-29 SURGERY — NEPHROLITHOTOMY PERCUTANEOUS
Anesthesia: General | Wound class: Clean Contaminated

## 2016-02-29 MED ORDER — ACETAMINOPHEN 325 MG PO TABS: 650.0000 mg | ORAL_TABLET | ORAL | Status: DC | PRN

## 2016-02-29 MED ORDER — FENTANYL CITRATE (PF) 100 MCG/2ML IJ SOLN
INTRAMUSCULAR | Status: DC | PRN
Start: 2016-02-29 — End: 2016-02-29
  Administered 2016-02-29 (×7): 50 ug via INTRAVENOUS

## 2016-02-29 MED ORDER — MIDAZOLAM HCL 5 MG/5ML IJ SOLN
INTRAMUSCULAR | Status: AC
  Filled 2016-02-29: qty 10

## 2016-02-29 MED ORDER — BUPIVACAINE-EPINEPHRINE (PF) 0.25% -1:200000 IJ SOLN
INTRAMUSCULAR | Status: AC
  Filled 2016-02-29: qty 30

## 2016-02-29 MED ORDER — HYDROMORPHONE HCL 1 MG/ML PO LIQD
0.5000 mg | Freq: Once | ORAL | Status: DC
  Filled 2016-02-29: qty 0.5

## 2016-02-29 MED ORDER — LIDOCAINE HCL (CARDIAC) 20 MG/ML IV SOLN
INTRAVENOUS | Status: DC | PRN
  Administered 2016-02-29: 100 mg via INTRAVENOUS

## 2016-02-29 MED ORDER — LIDOCAINE HCL (PF) 1 % IJ SOLN
INTRAMUSCULAR | Status: AC | PRN
Start: 2016-02-29 — End: 2016-02-29
  Administered 2016-02-29: 20 mL

## 2016-02-29 MED ORDER — OXYCODONE-ACETAMINOPHEN 5-325 MG PO TABS
1.0000 | ORAL_TABLET | ORAL | Status: DC | PRN
  Administered 2016-02-29: 2 via ORAL
  Filled 2016-02-29: qty 2

## 2016-02-29 MED ORDER — ROCURONIUM BROMIDE 100 MG/10ML IV SOLN
INTRAVENOUS | Status: DC | PRN
  Administered 2016-02-29: 50 mg via INTRAVENOUS
  Administered 2016-02-29: 10 mg via INTRAVENOUS
  Administered 2016-02-29 (×2): 5 mg via INTRAVENOUS

## 2016-02-29 MED ORDER — CEFAZOLIN SODIUM 1-5 GM-% IV SOLN
1.0000 g | Freq: Once | INTRAVENOUS | Status: AC
  Administered 2016-02-29: 1 g via INTRAVENOUS
  Filled 2016-02-29: qty 50

## 2016-02-29 MED ORDER — FAMOTIDINE 20 MG PO TABS
ORAL_TABLET | ORAL | Status: AC
  Administered 2016-02-29: 20 mg via ORAL
  Filled 2016-02-29: qty 1

## 2016-02-29 MED ORDER — HYDROMORPHONE HCL 1 MG/ML IJ SOLN
0.5000 mg | Freq: Once | INTRAMUSCULAR | Status: AC
  Administered 2016-02-29: 0.5 mg via INTRAVENOUS
  Filled 2016-02-29: qty 1

## 2016-02-29 MED ORDER — DIPHENHYDRAMINE HCL 50 MG/ML IJ SOLN: 12.5000 mg | Freq: Four times a day (QID) | INTRAMUSCULAR | Status: DC | PRN

## 2016-02-29 MED ORDER — PHENYLEPHRINE HCL 10 MG/ML IJ SOLN
INTRAMUSCULAR | Status: DC | PRN
  Administered 2016-02-29 (×6): 100 ug via INTRAVENOUS

## 2016-02-29 MED ORDER — PROPOFOL 10 MG/ML IV BOLUS
INTRAVENOUS | Status: DC | PRN
  Administered 2016-02-29: 40 mg via INTRAVENOUS
  Administered 2016-02-29: 160 mg via INTRAVENOUS

## 2016-02-29 MED ORDER — LACTATED RINGERS IV SOLN
INTRAVENOUS | Status: DC
  Administered 2016-02-29: 07:00:00 via INTRAVENOUS

## 2016-02-29 MED ORDER — PROMETHAZINE HCL 25 MG/ML IJ SOLN: 6.2500 mg | INTRAMUSCULAR | Status: DC | PRN

## 2016-02-29 MED ORDER — CEFAZOLIN SODIUM 1-5 GM-% IV SOLN
INTRAVENOUS | Status: AC
  Filled 2016-02-29: qty 50

## 2016-02-29 MED ORDER — HYDROMORPHONE HCL 1 MG/ML IJ SOLN
INTRAMUSCULAR | Status: AC
  Administered 2016-02-29: 0.5 mg via INTRAVENOUS
  Filled 2016-02-29: qty 1

## 2016-02-29 MED ORDER — HYDROMORPHONE HCL 1 MG/ML IJ SOLN
INTRAMUSCULAR | Status: AC
  Filled 2016-02-29: qty 1

## 2016-02-29 MED ORDER — DOCUSATE SODIUM 100 MG PO CAPS
100.0000 mg | ORAL_CAPSULE | Freq: Two times a day (BID) | ORAL | Status: DC
Start: 2016-02-29 — End: 2016-03-01
  Administered 2016-02-29 – 2016-03-01 (×2): 100 mg via ORAL
  Filled 2016-02-29 (×2): qty 1

## 2016-02-29 MED ORDER — HYDROMORPHONE HCL 1 MG/ML IJ SOLN
0.5000 mg | INTRAMUSCULAR | Status: DC | PRN
  Administered 2016-02-29: 0.5 mg via INTRAVENOUS
  Filled 2016-02-29: qty 1

## 2016-02-29 MED ORDER — FENTANYL CITRATE (PF) 100 MCG/2ML IJ SOLN
INTRAMUSCULAR | Status: AC
  Filled 2016-02-29: qty 2

## 2016-02-29 MED ORDER — CEFAZOLIN SODIUM-DEXTROSE 2-3 GM-% IV SOLR
INTRAVENOUS | Status: AC
  Filled 2016-02-29: qty 50

## 2016-02-29 MED ORDER — SODIUM CHLORIDE 0.9 % IV SOLN
10000.0000 ug | INTRAVENOUS | Status: DC | PRN
  Administered 2016-02-29: 25 ug/min via INTRAVENOUS

## 2016-02-29 MED ORDER — IOHEXOL 300 MG/ML  SOLN: 30.0000 mL | Freq: Once | INTRAMUSCULAR | Status: DC | PRN

## 2016-02-29 MED ORDER — GLYCOPYRROLATE 0.2 MG/ML IJ SOLN
INTRAMUSCULAR | Status: DC | PRN
  Administered 2016-02-29: 0.6 mg via INTRAVENOUS

## 2016-02-29 MED ORDER — ONDANSETRON HCL 4 MG/2ML IJ SOLN
INTRAMUSCULAR | Status: DC | PRN
  Administered 2016-02-29: 4 mg via INTRAVENOUS

## 2016-02-29 MED ORDER — CEFAZOLIN SODIUM-DEXTROSE 2-3 GM-% IV SOLR
2.0000 g | Freq: Once | INTRAVENOUS | Status: AC
  Administered 2016-02-29: 2 g via INTRAVENOUS

## 2016-02-29 MED ORDER — INFLUENZA VAC SPLIT QUAD 0.5 ML IM SUSY
0.5000 mL | PREFILLED_SYRINGE | INTRAMUSCULAR | Status: AC
  Administered 2016-03-01: 0.5 mL via INTRAMUSCULAR
  Filled 2016-02-29: qty 0.5

## 2016-02-29 MED ORDER — DIPHENHYDRAMINE HCL 12.5 MG/5ML PO ELIX: 12.5000 mg | ORAL_SOLUTION | Freq: Four times a day (QID) | ORAL | Status: DC | PRN

## 2016-02-29 MED ORDER — IOTHALAMATE MEGLUMINE 43 % IV SOLN
INTRAVENOUS | Status: DC | PRN
  Administered 2016-02-29: 55 mL

## 2016-02-29 MED ORDER — FAMOTIDINE 20 MG PO TABS
20.0000 mg | ORAL_TABLET | Freq: Once | ORAL | Status: AC
  Administered 2016-02-29: 20 mg via ORAL

## 2016-02-29 MED ORDER — BELLADONNA ALKALOIDS-OPIUM 16.2-60 MG RE SUPP: 1.0000 | Freq: Four times a day (QID) | RECTAL | Status: DC | PRN

## 2016-02-29 MED ORDER — OXYBUTYNIN CHLORIDE 5 MG PO TABS
5.0000 mg | ORAL_TABLET | Freq: Three times a day (TID) | ORAL | Status: DC | PRN
  Administered 2016-02-29: 5 mg via ORAL
  Filled 2016-02-29: qty 1

## 2016-02-29 MED ORDER — MIDAZOLAM HCL 5 MG/5ML IJ SOLN
INTRAMUSCULAR | Status: AC | PRN
  Administered 2016-02-29: 1 mg via INTRAVENOUS
  Administered 2016-02-29 (×4): 0.5 mg via INTRAVENOUS
  Administered 2016-02-29: 2 mg via INTRAVENOUS

## 2016-02-29 MED ORDER — HYDROMORPHONE HCL 1 MG/ML IJ SOLN
0.2500 mg | INTRAMUSCULAR | Status: DC | PRN
Start: 2016-02-29 — End: 2016-02-29

## 2016-02-29 MED ORDER — MIDAZOLAM HCL 2 MG/2ML IJ SOLN
INTRAMUSCULAR | Status: DC | PRN
  Administered 2016-02-29: 2 mg via INTRAVENOUS

## 2016-02-29 MED ORDER — LACTATED RINGERS IV SOLN
INTRAVENOUS | Status: DC | PRN
  Administered 2016-02-29 (×3): via INTRAVENOUS

## 2016-02-29 MED ORDER — CEFAZOLIN SODIUM 1-5 GM-% IV SOLN
1.0000 g | Freq: Three times a day (TID) | INTRAVENOUS | Status: AC
  Administered 2016-02-29 – 2016-03-01 (×2): 1 g via INTRAVENOUS
  Filled 2016-02-29 (×2): qty 50

## 2016-02-29 MED ORDER — SODIUM CHLORIDE 0.9 % IV SOLN
INTRAVENOUS | Status: DC
  Administered 2016-02-29 – 2016-03-01 (×2): via INTRAVENOUS

## 2016-02-29 MED ORDER — MORPHINE SULFATE (PF) 2 MG/ML IV SOLN
2.0000 mg | INTRAVENOUS | Status: DC | PRN
  Administered 2016-02-29: 2 mg via INTRAVENOUS
  Administered 2016-03-01: 4 mg via INTRAVENOUS
  Filled 2016-02-29: qty 2
  Filled 2016-02-29: qty 1

## 2016-02-29 MED ORDER — LIDOCAINE HCL (PF) 1 % IJ SOLN
INTRAMUSCULAR | Status: AC
  Filled 2016-02-29: qty 30

## 2016-02-29 MED ORDER — IOHEXOL 300 MG/ML  SOLN
30.0000 mL | Freq: Once | INTRAMUSCULAR | Status: AC | PRN
  Administered 2016-02-29: 35 mL

## 2016-02-29 MED ORDER — NEOSTIGMINE METHYLSULFATE 10 MG/10ML IV SOLN
INTRAVENOUS | Status: DC | PRN
  Administered 2016-02-29: 4 mg via INTRAVENOUS

## 2016-02-29 MED ORDER — ONDANSETRON HCL 4 MG/2ML IJ SOLN
4.0000 mg | INTRAMUSCULAR | Status: DC | PRN
  Administered 2016-02-29 – 2016-03-01 (×2): 4 mg via INTRAVENOUS
  Filled 2016-02-29 (×2): qty 2

## 2016-02-29 MED ORDER — MIDAZOLAM HCL 5 MG/ML IJ SOLN
5.0000 mg | Freq: Once | INTRAMUSCULAR | Status: DC
  Filled 2016-02-29: qty 1

## 2016-02-29 MED ORDER — LACTATED RINGERS IV BOLUS (SEPSIS)
300.0000 mL | Freq: Once | INTRAVENOUS | Status: AC
  Administered 2016-02-29: 300 mL via INTRAVENOUS

## 2016-02-29 SURGICAL SUPPLY — 68 items
ADAPTER IRRIG TUBE 2 SPIKE SOL (ADAPTER) ×6 IMPLANT
ADAPTER SCOPE UROLOK II (MISCELLANEOUS) ×3 IMPLANT
ADPR INSRT BALL FIT URLK2 (MISCELLANEOUS) ×1
ADPR TBG 2 SPK PMP STRL ASCP (ADAPTER) ×2
BAG URO DRAIN 2000ML W/SPOUT (MISCELLANEOUS) ×3 IMPLANT
BALLN NEPHROMAX 24X8X12 (CATHETERS) ×3
BALLOON NEPHROMAX 24X8X12 (CATHETERS) ×1 IMPLANT
BASKET ZERO TIP 1.9FR (BASKET) ×2 IMPLANT
BLADE SURG 15 STRL LF DISP TIS (BLADE) ×1 IMPLANT
BLADE SURG 15 STRL SS (BLADE) ×3
BSKT STON RTRVL ZERO TP 1.9FR (BASKET) ×1
CATH BRONCH ASP 14F (CATHETERS) ×3 IMPLANT
CATH COUNCIL 22FR (CATHETERS) ×3 IMPLANT
CATH STENT KAYE NEPHR TAMP (CATHETERS) ×1 IMPLANT
CATH TRAY 16F METER LATEX (MISCELLANEOUS) ×3 IMPLANT
CATH URETL 5X70 OPEN END (CATHETERS) ×3 IMPLANT
CATH/STENT KAYE NEPHR TAMP (CATHETERS) ×3
CHLORAPREP W/TINT 26ML (MISCELLANEOUS) ×3 IMPLANT
CNTNR SPEC 2.5X3XGRAD LEK (MISCELLANEOUS) ×1
CONRAY 43 FOR UROLOGY 50M (MISCELLANEOUS) ×12 IMPLANT
CONT SPEC 4OZ STER OR WHT (MISCELLANEOUS) ×2
CONT SPEC 4OZ STRL OR WHT (MISCELLANEOUS) ×1
CONTAINER SPEC 2.5X3XGRAD LEK (MISCELLANEOUS) ×1 IMPLANT
DEVICE INFLATION 26 ENCORE (MISCELLANEOUS) ×3 IMPLANT
DRAPE C-ARM XRAY 36X54 (DRAPES) ×3 IMPLANT
DRAPE SHEET LG 3/4 BI-LAMINATE (DRAPES) ×3 IMPLANT
DRAPE SURG 17X11 SM STRL (DRAPES) ×12 IMPLANT
GAUZE SPONGE 4X4 12PLY STRL (GAUZE/BANDAGES/DRESSINGS) ×3 IMPLANT
GLIDEWIRE STIFF .35X180X3 HYDR (WIRE) ×3 IMPLANT
GLOVE BIO SURGEON STRL SZ 6.5 (GLOVE) ×4 IMPLANT
GLOVE BIO SURGEON STRL SZ7 (GLOVE) ×6 IMPLANT
GLOVE BIO SURGEONS STRL SZ 6.5 (GLOVE) ×2
GLOVE BIOGEL PI IND STRL 6.5 (GLOVE) ×2 IMPLANT
GLOVE BIOGEL PI INDICATOR 6.5 (GLOVE) ×4
GOWN STRL REUS W/ TWL LRG LVL3 (GOWN DISPOSABLE) ×2 IMPLANT
GOWN STRL REUS W/TWL LRG LVL3 (GOWN DISPOSABLE) ×6
GUIDEWIRE GREEN .038 145CM (MISCELLANEOUS) ×3 IMPLANT
GUIDEWIRE INTRO SET STRAIGHT (WIRE) ×3 IMPLANT
GUIDEWIRE STR ZIPWIRE 035X150 (MISCELLANEOUS) ×3 IMPLANT
GUIDEWIRE SUPER STIFF .035X180 (WIRE) ×3 IMPLANT
HOLDER FOLEY CATH W/STRAP (MISCELLANEOUS) ×3 IMPLANT
INTRODUCER DILATOR DOUBLE (INTRODUCER) ×3 IMPLANT
MANIFOLD NEPTUNE II (INSTRUMENTS) ×3 IMPLANT
NDL FASCIA INCISION 18GA (NEEDLE) ×3 IMPLANT
PACK BASIN MINOR ARMC (MISCELLANEOUS) ×3 IMPLANT
PAD ABD DERMACEA PRESS 5X9 (GAUZE/BANDAGES/DRESSINGS) ×3 IMPLANT
PROBE CYBERWAND SET (MISCELLANEOUS) ×4 IMPLANT
SENSORWIRE 0.038 NOT ANGLED (WIRE) ×6
SET IRRIG Y TYPE TUR BLADDER L (SET/KITS/TRAYS/PACK) ×3 IMPLANT
SET IRRIGATING DISP (SET/KITS/TRAYS/PACK) ×3 IMPLANT
SHEET NEURO XL SOL CTL (MISCELLANEOUS) ×3 IMPLANT
SOL .9 NS 3000ML IRR  AL (IV SOLUTION) ×12
SOL .9 NS 3000ML IRR AL (IV SOLUTION) ×6
SOL .9 NS 3000ML IRR UROMATIC (IV SOLUTION) ×6 IMPLANT
SPONGE DRAIN TRACH 4X4 STRL 2S (GAUZE/BANDAGES/DRESSINGS) ×3 IMPLANT
STENT URET 6FRX26 CONTOUR (STENTS) IMPLANT
STENT URET 6FRX28 CONTOUR (STENTS) ×2 IMPLANT
SUT SILK 0 SH 30 (SUTURE) ×3 IMPLANT
SYR 20CC LL (SYRINGE) ×3 IMPLANT
SYR 30ML LL (SYRINGE) ×3 IMPLANT
SYRINGE 10CC LL (SYRINGE) ×3 IMPLANT
SYRINGE IRR TOOMEY STRL 70CC (SYRINGE) ×3 IMPLANT
TAPE MICROFOAM 4IN (TAPE) ×3 IMPLANT
TRAP SPECIMEN MUCOUS 40CC (MISCELLANEOUS) ×3 IMPLANT
TUBING CONNECTING 10 (TUBING) ×4 IMPLANT
TUBING CONNECTING 10' (TUBING) ×2
WATER STERILE IRR 1000ML POUR (IV SOLUTION) ×3 IMPLANT
WIRE SENSOR 0.038 NOT ANGLED (WIRE) ×2 IMPLANT

## 2016-02-29 NOTE — Procedures (Signed)
Attempts to gain access into lower pole calyx were unsuccessful. Under flouro guidance, 8.80F nephrostomy catheter was placed through middle pole calyx. No immediate complications.

## 2016-02-29 NOTE — Progress Notes (Signed)
ANTIBIOTIC CONSULT NOTE - INITIAL  Pharmacy Consult for Renal adjustment of antibiotics  Indication: renal adjustment  Allergies  Allergen Reactions  . Fentanyl Other (See Comments)    Change in mentation for long period of time    Patient Measurements: Height: 6' (182.9 cm) Weight: 185 lb (83.915 kg) IBW/kg (Calculated) : 77.6 Adjusted Body Weight:   Vital Signs: Temp: 98.1 F (36.7 C) (03/13 1853) Temp Source: Oral (03/13 1853) BP: 112/66 mmHg (03/13 1853) Pulse Rate: 94 (03/13 1853) Intake/Output from previous day:   Intake/Output from this shift: Total I/O In: 1900 [I.V.:1900] Out: 650 [Urine:550; Blood:100]  Labs:  Recent Labs  02/29/16 1807  HGB 13.3  CREATININE 1.45*   Estimated Creatinine Clearance: 72.8 mL/min (by C-G formula based on Cr of 1.45). No results for input(s): VANCOTROUGH, VANCOPEAK, VANCORANDOM, GENTTROUGH, GENTPEAK, GENTRANDOM, TOBRATROUGH, TOBRAPEAK, TOBRARND, AMIKACINPEAK, AMIKACINTROU, AMIKACIN in the last 72 hours.   Microbiology: Recent Results (from the past 720 hour(s))  Urine culture     Status: None   Collection Time: 02/18/16  9:46 AM  Result Value Ref Range Status   Specimen Description URINE, RANDOM  Final   Special Requests Normal  Final   Culture NO GROWTH 2 DAYS  Final   Report Status 02/20/2016 FINAL  Final    Medical History: Past Medical History  Diagnosis Date  . Renal disorder   . Kidney stones     Medications:  Scheduled:  . ceFAZolin      .  ceFAZolin (ANCEF) IV  1 g Intravenous Q8H  . docusate sodium  100 mg Oral BID  . HYDROmorphone      . midazolam       Assessment: CrCl = 72.8 ml/min   Goal of Therapy:  renal adjust abx  Plan:  Cefazolin 1 gm IV Q8H currently ordered.  No dose adjustment needed.   Jonnelle Lawniczak D 02/29/2016,7:00 PM

## 2016-02-29 NOTE — Transfer of Care (Signed)
Immediate Anesthesia Transfer of Care Note  Patient: Christian Pace  Procedure(s) Performed: Procedure(s): NEPHROLITHOTOMY PERCUTANEOUS cystoscopy right stent removal (N/A)  Patient Location: PACU  Anesthesia Type:General  Level of Consciousness: awake and alert   Airway & Oxygen Therapy: Patient Spontanous Breathing and Patient connected to face mask oxygen  Post-op Assessment: Report given to RN  Post vital signs: Reviewed  Last Vitals:  Filed Vitals:   02/29/16 1034 02/29/16 1740  BP: 119/72 117/70  Pulse: 94 110  Temp:  36.8 C  Resp: 17 14    Complications: No apparent anesthesia complications

## 2016-02-29 NOTE — H&P (Signed)
02/29/16 3:16 PM   Christian Pace [birth year] [id number]  Referring provider: No referring provider defined for this encounter.  Chief Complaint  Patient presents with  . Cysto Stent Removal    HPI: 45 year old male with extensive bilateral nephrolithiasis who underwent staged left ureteroscopy for treatment of his left-sided stones. He returns today for office cystoscopy to remove his stent.  He continues to have a significant right-sided stone burden including a 10 mm calculus at the right UPJ as well as numerous other nonobstructing calculi on the right.  History of renal stones x 20 years. No previous stone interventions. Reports that he thinks he has passed approximately 6 stones spontaneously.  Stone analysis shows stone composition 90% calcium oxalate monohydrate, 10% calcium phosphate.  Initially on presentation, his BMP was noted to be upright with elevated creatinine to 2.41. This is since improved to 1.3 for which is his baseline.  He has no complaints other than irritation from the stent. No fevers or chills.  PMH: Past Medical History  Diagnosis Date  . Renal disorder   . Kidney stones     Surgical History: Past Surgical History  Procedure Laterality Date  . Hernia repair    . Ureteroscopy with holmium laser lithotripsy Left 12/02/2015    Procedure: URETEROSCOPY WITH HOLMIUM LASER LITHOTRIPSY WITH RETROGRADE PYLOGRAM; Surgeon: Hollice Espy, MD; Location: [facility]S; Service: Urology; Laterality: Left;  . Cystoscopy with stent placement Bilateral 12/02/2015    Procedure: CYSTOSCOPY WITH STENT PLACEMENT,bilateral retrograde pylorograms; Surgeon: Hollice Espy, MD; Location: [facility] OR[facility]; Service: Urology; Laterality: Bilateral;  . Ureteroscopy with holmium laser lithotripsy Left 01/04/2016    Procedure: URETEROSCOPY WITH HOLMIUM LASER LITHOTRIPSY; Surgeon: Hollice Espy, MD; Location: [facility]; Service: Urology;  Laterality: Left;  . Cystoscopy w/ ureteral stent placement Bilateral 01/04/2016    Procedure: CYSTOSCOPY WITH STENT REPLACEMENT; Surgeon: Hollice Espy, MD; Location: [facility] OR[facility]; Service: Urology; Laterality: Bilateral;    Home Medications:    Medication List       This list is accurate as of: 01/13/16 11:59 PM. Always use your most recent med list.              ciprofloxacin 500 MG tablet  Commonly known as: CIPRO  Take 1 tablet (500 mg total) by mouth every 12 (twelve) hours.     HYDROmorphone 2 MG tablet  Commonly known as: DILAUDID  Take 1 tablet (2 mg total) by mouth every 12 (twelve) hours as needed for severe pain.     ondansetron 4 MG tablet  Commonly known as: ZOFRAN  Take 1 tablet (4 mg total) by mouth every 8 (eight) hours as needed for nausea or vomiting.     oxybutynin 5 MG tablet  Commonly known as: DITROPAN  Take 1 tablet (5 mg total) by mouth every 8 (eight) hours as needed for bladder spasms.     oxyCODONE-acetaminophen 5-325 MG tablet  Commonly known as: PERCOCET  Take 1-2 tablets by mouth every 4 (four) hours as needed for moderate pain or severe pain.     phenazopyridine 200 MG tablet  Commonly known as: PYRIDIUM  Take 1 tablet (200 mg total) by mouth 3 (three) times daily as needed for pain.     tamsulosin 0.4 MG Caps capsule  Commonly known as: FLOMAX  Take 1 capsule (0.4 mg total) by mouth daily.        Allergies: No Known Allergies  Family History: No family history on file.  Social History:  reports that he has never smoked.  He does not have any smokeless tobacco history on file. He reports that he drinks alcohol. He reports that he does not use illicit drugs.   Physical Exam: BP 132/80 mmHg  Pulse 82  Ht 6' (1.829 m)  Wt 194 lb 14.4 oz (88.406 kg)  BMI 26.43 kg/m2  Constitutional: Alert and oriented, No acute distress. HEENT: [place] AT, moist mucus membranes.  Trachea midline, no masses. Cardiovascular: No clubbing, cyanosis, or edema. RRR. Respiratory: Normal respiratory effort, no increased work of breathing. CTAB, GI: Abdomen is soft, nontender, nondistended, no abdominal masses GU: No CVA tenderness. Normal phallus with orthotopic patent meatus. Skin: No rashes, bruises or suspicious lesions.. Neurologic: Grossly intact, no focal deficits, moving all 4 extremities. Psychiatric: Normal mood and affect.  Laboratory Data:  Recent Labs    Lab Results  Component Value Date   WBC 10.8* 11/27/2015   HGB 15.4 11/27/2015   HCT 46.1 11/27/2015   MCV 88.5 11/27/2015   PLT 226 11/27/2015       Recent Labs    Lab Results  Component Value Date   CREATININE 1.34* 12/09/2015     Urinalysis Results for orders placed or performed in visit on 01/13/16  Microscopic Examination  Result Value Ref Range   WBC, UA 0-5 0 - 5 /hpf   RBC, UA >30 (H) 0 - 2 /hpf   Epithelial Cells (non renal) None seen 0 - 10 /hpf   Mucus, UA Present (A) Not Estab.   Bacteria, UA Many (A) None seen/Few  Urinalysis, Complete  Result Value Ref Range   Specific Gravity, UA 1.015 1.005 - 1.030   pH, UA 6.5 5.0 - 7.5   Color, UA Yellow Yellow   Appearance Ur Cloudy (A) Clear   Leukocytes, UA 1+ (A) Negative   Protein, UA 2+ (A) Negative/Trace   Glucose, UA Negative Negative   Ketones, UA Negative Negative   RBC, UA 3+ (A) Negative   Bilirubin, UA Negative Negative   Urobilinogen, Ur 0.2 0.2 - 1.0 mg/dL   Nitrite, UA Negative Negative   Microscopic Examination See below:     Pertinent Imaging:  CLINICAL DATA: 45 year old male with right-sided flank pain since 4 a.m. Nausea. Prior history of kidney stones. History of bilateral inguinal hernia repair.  EXAM: CT ABDOMEN AND PELVIS WITHOUT CONTRAST  TECHNIQUE: Multidetector CT imaging of  the abdomen and pelvis was performed following the standard protocol without IV contrast.  COMPARISON: CT of the abdomen and pelvis 05/04/2013.  FINDINGS: Lower chest: Unremarkable.  Hepatobiliary: No definite cystic or solid hepatic lesions are identified on today's noncontrast CT examination. Unenhanced appearance of the gallbladder is normal.  Pancreas: No pancreatic mass or peripancreatic inflammatory changes on today's noncontrast CT examination.  Spleen: Unremarkable.  Adrenals/Urinary Tract: There are numerous nonobstructive calculi in the collecting systems of the kidneys bilaterally measuring up to 1.3 cm in the upper pole of the left kidney. In addition, there is a 10 mm calculus at the right ureteropelvic junction. There is also an 8 mm calculus at the left ureterovesicular junction. Neither of these are associated with significant proximal hydroureteronephrosis, although there is some very mild fullness in the right renal collecting system. No bladder calculi. Bilateral adrenal glands are normal in appearance.  Stomach/Bowel: Normal appearance of the stomach. No pathologic dilatation of small bowel or colon. Normal appendix.  Vascular/Lymphatic: No significant atherosclerotic calcifications or definite aneurysm identified in the abdominal or pelvic vasculature. No lymphadenopathy noted in the abdomen or pelvis on today's  noncontrast CT examination.  Reproductive: Prostate gland and seminal vesicles are unremarkable in appearance.  Other: Postoperative changes of bilateral inguinal herniorrhaphy are noted. No significant volume of ascites. No pneumoperitoneum.  Musculoskeletal: There are no aggressive appearing lytic or blastic lesions noted in the visualized portions of the skeleton.  IMPRESSION: 1. In addition to numerous nonobstructive calculi in the collecting systems of the kidneys bilaterally, the patient has a 10 mm calculus at the right  ureteropelvic junction, and a 8 mm calculus at the left ureterovesicular junction. At this time, there is only mild fullness in the right renal collecting system, which could suggest very mild obstruction on the right side, but there is no associated left-sided hydroureteronephrosis. 2. Normal appendix. 3. Status post bilateral inguinal herniorrhaphy.   Electronically Signed  By: Vinnie Langton M.D.  On: 11/27/2015 08:30       Cystoscopy/ Stent removal procedure  Patient identification was confirmed, informed consent was obtained, and patient was prepped using Betadine solution. Lidocaine jelly was administered per urethral meatus.   Preoperative abx where received prior to procedure.   Procedure: - Flexible cystoscope introduced, without any difficulty.  - Thorough search of the bladder revealed:  normal urethral meatus  Stent seen emanating from both ureteral orifices. The LEFT stent was grasped with stent graspers, and removed in entirety. The RIGHT stent was left in place.  Post-Procedure: - Patient tolerated the procedure well   Assessment & Plan:   1. Nephrolithiasis S/p L URS (staged) and now left stent removal  Right ureteral stent remains in place with significant right proximal ureteral and nonobstucting stone burden.   Discussed options today for right sided stones. Given the amount of stone material, recommend right PNCL for treatment of this stone as given the over al stone burden and hardness of stones, it will require the fewest procedures to achieve a stone free rate. Risk of right PCNL (with likely antegrade URS) discussed at length including risk of bleeding, infection, damage to surround structures, and need for subsequent procedure. Hospital course and post op care discussed as well. He is agreeable with this plan.  - Urinalysis, Complete -preop labs/ repeat urine culture   Schedule R PNCL with IR placement of nephrostomy tube  on morning of the procedure for posterior lower pole access.   Hollice Espy, MD  [facility] [address], [place], [place] [address] [phone]

## 2016-02-29 NOTE — Procedures (Signed)
Discussed procedure and risks with patient and his father. Informed consent obtained. Will perform flouro-guided right nephrostomy to provide access for percutaneous nephrolithotomy to be performed later today by Dr. Erlene Quan.

## 2016-02-29 NOTE — Op Note (Signed)
Date of procedure: 02/29/2016  Preoperative diagnosis:  1. Bilateral nonobstructing nephrolithiasis   Postoperative diagnosis:  1. Same as above   Procedure: 1. Right percutaneous nephrolithotomy 2. Right antegrade nephrostogram 3. Interpretation of fluoroscopy less than 30 minutes 4. Right percutaneous nephrostomy tube exchange 5. Cystoscopy 6. Left ureteral stent removal  Surgeon: Hollice Espy, MD  Assisting surgeon: Louis Meckel, M.D.  Anesthesia: General  Complications: None  Intraoperative findings: Multiple right nonobstructing kidney stones removed, largest measuring 1 cm x  ~10 stones  EBL: 100 cc  Specimens: Stone fragment  Drains: 18 [place] council tip Foley catheter as right nephrostomy tube, 5 [place] open-ended nephroureteral catheter, 16 French penile Foley catheter  Indication: Christian Pace is a 45 y.o. patient with significant bilateral nephrolithiasis who underwent staged left ureteroscopy and bilateral ureteral stent placement. He returns today to the operating room to treat his right-sided stone burden. Given the very hard stones and number, he elected to undergo right percutaneous nephrolithotomy to treat his stones.  After reviewing the management options for treatment, he elected to proceed with the above surgical procedure(s). We have discussed the potential benefits and risks of the procedure, side effects of the proposed treatment, the likelihood of the patient achieving the goals of the procedure, and any potential problems that might occur during the procedure or recuperation. Informed consent has been obtained.  Description of procedure:  The patient was taken to the operating room and general anesthesia was induced.  A 16 French Foley catheter was placed using standard sterile techniques. He was then positioned in a prone position and care taken to pad all pressure points. He was strapped to the table using straps, gel pads, and tape. He was  then prepped and draped in the standard surgical fashion. The previously placed nephrostomy tube placed earlier today by interventional radiology into a midpole calyx was prepped into the field.   At this point in time, an antegrade nephrostogram was performed through the right nephrostomy tube. This confirmed the position of the nephrotomy tube into a midpole calyx. The collecting system was decompressed and the infundibula were noted to be relatively long and narrow and all upper mid and lower pole complexes. There is no hydroureteronephrosis. Stones could be seen in almost every calyx fluoroscopically. At this point in time, the nephrostomy tube was cannulated using a sensor wire and the wire was able to be positioned in the upper pole. The nephrostomy tube was removed leaving the wire in place. A safety wire introducer was then used to place a sensor wire down the ureter into the bladder. A second wire was then placed down the ureter into the bladder as well and the previously placed sensor wire was removed. The sensor wire was snapped in place as the safety wire. This previously placed Super Stiff wire was used as a working wire. At this point in time, a fascial incisor needle was used to incise the fascia to help facilitate the sheath placement. A 20 cm NephroMax balloon was then advanced over the Super Stiff wire into the desired position which was thought to be within the midpole calyx and the balloon was filled to 18 cm of water. This was performed using contrast solution to directly visualize the balloon as it inflated. The sheath was then advanced over the wire into the desired position and the balloon was removed leaving the wire and placed through the sheath. There is no excessive bleeding noted. The nephroscope was then introduced at this point in  time and unfortunately the anatomy was somewhat distorted and there is evidence of fat. 2 wires could be seen going through a small opening which a first I  felt was the UPJ and had some concern for UPJ trauma given the presence of fat. At this point in time, Dr. Louis Meckel was called into the room for a second opinion. He helped to confirm that in fact the sheath was not advanced far enough and the fat was Gerota's fat in the narrowed area was in fact a stenotic infundibulum. The balloon dilator was then used this time advanced further towards the UPJ to redilate into the collecting system this time ultimately successful using a similar technique as described. Never scope was then brought in and normal urothelial mucosa was identified. There was also multiple stones noted throughout the collecting system. Some of these were able to be grasped using rigid graspers. Given the less than ideal access, the nephroscope was then exchanged for the flexible cystoscope and ureteroscope in each and every excessive calyx was directly visualized in the stones were basketed out.  Incidentally, there is only one small stone in the upper pole which was not compatible with the CT scan findings. There were several Randall's plaques and submucosal stones which did not appear accessible ureteroscopically.  A total of a proximally 10 stones were basketed out of the collecting system measuring up to 1 cm in size. The flexible ureteroscope was then advanced down into the proximal mid ureter and there was no obstructing stone fragments or injury noted to the ureter. Finally, the procedure was deemed nearly complete.  A 18 French council tip catheter was advanced over [employer] Stiff wire into the renal pelvis and the balloon was inflated with 2 cc of dilute contrast solution. A 5 French open-ended ureteral catheter was advanced through the council tip down to level of the mid ureter which was left in place and the wire was removed. An antegrade nephrostogram was performed through the nephrostomy tube again with a small amount of extravasation but otherwise a collecting system was clearly  identified in the tube was in good position. The tube was then sutured in place using silk sutures 2. Half percent lidocaine with epi was used as a local anesthetic.  The patient was then cleaned and dried and a dressing of 4 x 4's, an AVD pad, and foam tape was applied.   Incidentally, on fluoroscopy of the bladder, the left-sided stent was noted to be in place. It appeared that in the clinic, the wrong stent had inadvertently been removed therefore, the patient was repositioned on the stretcher in the supine position and his penis was prepped after the previously placed Foley was removed. A flexible cystoscope was introduced per urethra into the bladder. Attention was turned to the left ureteral orifice from which a ureteral stent was seen emanating. Stent graspers was used to grasp the distal coil of the stent and remove the entire stent which came out easily. A 16 French Foley catheter was then replaced under sterile conditions. The patient was then reversed from anesthesia, taken to the PACU in stable condition.   Plan: Patient will be admitted for observation. Postop labs will be drawn in the PACU. Nephrostomy tube and Foley catheter will be left in place until tomorrow. KUB in the morning. Ideally, both the nephrostomy tube and the nephroureteral stent over the removed prior to discharge.  Hollice Espy, M.D.

## 2016-02-29 NOTE — Anesthesia Preprocedure Evaluation (Signed)
Anesthesia Evaluation  Patient identified by MRN, date of birth, ID band Patient awake    Reviewed: Allergy & Precautions, H&P , NPO status , Patient's Chart, lab work & pertinent test results, reviewed documented beta blocker date and time   History of Anesthesia Complications Negative for: history of anesthetic complications  Airway Mallampati: II  TM Distance: >3 FB Neck ROM: full    Dental no notable dental hx. (+) Teeth Intact   Pulmonary neg pulmonary ROS,    Pulmonary exam normal        Cardiovascular Exercise Tolerance: Good negative cardio ROS Normal cardiovascular exam Rate:Normal     Neuro/Psych negative neurological ROS  negative psych ROS   GI/Hepatic negative GI ROS, Neg liver ROS,   Endo/Other  negative endocrine ROS  Renal/GU Renal disease (kidney stones)  negative genitourinary   Musculoskeletal   Abdominal   Peds  Hematology negative hematology ROS (+)   Anesthesia Other Findings   Reproductive/Obstetrics negative OB ROS                             Anesthesia Physical  Anesthesia Plan  ASA: II  Anesthesia Plan: General ETT   Post-op Pain Management:    Induction:   Airway Management Planned:   Additional Equipment:   Intra-op Plan:   Post-operative Plan:   Informed Consent: I have reviewed the patients History and Physical, chart, labs and discussed the procedure including the risks, benefits and alternatives for the proposed anesthesia with the patient or authorized representative who has indicated his/her understanding and acceptance.     Plan Discussed with: CRNA, Anesthesiologist and Surgeon  Anesthesia Plan Comments:         Anesthesia Quick Evaluation

## 2016-02-29 NOTE — Anesthesia Procedure Notes (Signed)
Procedure Name: Intubation Performed by: Lance Muss Pre-anesthesia Checklist: Emergency Drugs available, Patient identified, Suction available, Patient being monitored and Timeout performed Patient Re-evaluated:Patient Re-evaluated prior to inductionOxygen Delivery Method: Circle system utilized Preoxygenation: Pre-oxygenation with 100% oxygen Intubation Type: IV induction Ventilation: Two handed mask ventilation required and Oral airway inserted - appropriate to patient size Laryngoscope Size: Mac and 4 Grade View: Grade II Tube type: Oral Tube size: 7.5 mm Number of attempts: 1 Airway Equipment and Method: Stylet Placement Confirmation: ETT inserted through vocal cords under direct vision,  positive ETCO2 and breath sounds checked- equal and bilateral Secured at: 22 cm Tube secured with: Tape Dental Injury: Teeth and Oropharynx as per pre-operative assessment

## 2016-02-29 NOTE — OR Nursing (Signed)
Area noted on left ear(appears to be dried blood).  Patient states that Dr Erlene Quan is aware and had made him an appointment with a dermatologist, however patient had to cancel.

## 2016-02-29 NOTE — Progress Notes (Signed)
Patient to OR

## 2016-03-01 ENCOUNTER — Encounter: Payer: Self-pay | Admitting: Urology

## 2016-03-01 ENCOUNTER — Observation Stay: Payer: BLUE CROSS/BLUE SHIELD

## 2016-03-01 DIAGNOSIS — N200 Calculus of kidney: Secondary | ICD-10-CM | POA: Diagnosis not present

## 2016-03-01 LAB — BASIC METABOLIC PANEL
Anion gap: 4 — ABNORMAL LOW (ref 5–15)
BUN: 18 mg/dL (ref 6–20)
CALCIUM: 8 mg/dL — AB (ref 8.9–10.3)
CO2: 23 mmol/L (ref 22–32)
CREATININE: 1.27 mg/dL — AB (ref 0.61–1.24)
Chloride: 108 mmol/L (ref 101–111)
GFR calc Af Amer: >60 mL/min (ref >60)
GLUCOSE: 119 mg/dL — AB (ref 65–99)
POTASSIUM: 4.3 mmol/L (ref 3.5–5.1)
SODIUM: 135 mmol/L (ref 135–145)

## 2016-03-01 LAB — HEMOGLOBIN AND HEMATOCRIT, BLOOD
HEMATOCRIT: 36.1 % — AB (ref 40.0–52.0)
HEMOGLOBIN: 11.9 g/dL — AB (ref 13.0–18.0)

## 2016-03-01 MED ORDER — DOCUSATE SODIUM 100 MG PO CAPS: 100.0000 mg | ORAL_CAPSULE | Freq: Two times a day (BID) | ORAL | Status: DC

## 2016-03-01 MED ORDER — HYDROCODONE-ACETAMINOPHEN 5-325 MG PO TABS: 1.0000 | ORAL_TABLET | Freq: Four times a day (QID) | ORAL | Status: DC | PRN

## 2016-03-01 NOTE — Progress Notes (Signed)
Initial Nutrition Assessment   INTERVENTION:  Meals and snacks: Cater to pt preferences    NUTRITION DIAGNOSIS:   Inadequate oral intake related to acute illness as evidenced by per patient/family report.    GOAL:   Patient will meet greater than or equal to 90% of their needs    MONITOR:    (Energy intake)  REASON FOR ASSESSMENT:   Malnutrition Screening Tool    ASSESSMENT:      Pt admitted with right kidney stone.   Past Medical History  Diagnosis Date  . Renal disorder   . Kidney stones     Current Nutrition: no breakfast eaten this am per pt feeling a little nauseated.  Wanting crackers to nibble on  Food/Nutrition-Related History: pt reports normal intake prior to admission. Reports has cut out drinking soda to reduce risk of kidney stones and reports has had subsequent wt loss.   Scheduled Medications:  . docusate sodium  100 mg Oral BID    Continuous Medications:  . sodium chloride 125 mL/hr at 03/01/16 0424     Electrolyte/Renal Profile and Glucose Profile:   Recent Labs Lab 02/29/16 1807 03/01/16 0322  NA 139 135  K 4.2 4.3  CL 107 108  CO2 26 23  BUN 17 18  CREATININE 1.45* 1.27*  CALCIUM 8.3* 8.0*  GLUCOSE 128* 119*    Gastrointestinal Profile: Last BM: 3/13    Weight Change: pt reports wt loss from not drinking sodas    Diet Order:  Diet regular Room service appropriate?: Yes; Fluid consistency:: Thin  Skin: reviewed   Height:   Ht Readings from Last 1 Encounters:  02/29/16 6' (1.829 m)    Weight:   Wt Readings from Last 1 Encounters:  02/29/16 185 lb (83.915 kg)    Ideal Body Weight:     BMI:  Body mass index is 25.08 kg/(m^2).  EDUCATION NEEDS:   No education needs identified at this time  LOW Care Level  Christian Pace, [place], [place] (pager) Weekend/On-Call pager [phone])

## 2016-03-01 NOTE — Discharge Summary (Signed)
Date of admission: 02/29/2016  Date of discharge: 03/01/2016  Admission diagnosis: right kidney stones   Discharge diagnosis: right kidney stones  Secondary diagnoses:  Patient Active Problem List   Diagnosis Date Noted  . Right kidney stone 02/29/2016    History and Physical: For full details, please see admission history and physical. Briefly, Christian Pace is a 45 y.o. year old patient with right nephrolithiasis admitted post op s/p R PNCL, cysto/stent removal.    Hospital Course: Patient tolerated the procedure well.  He was then transferred to the floor after an uneventful PACU stay.  His hospital course was uncomplicated.  On POD#*1 he had met discharge criteria: was eating a regular diet, was up and ambulating independently,  pain was well controlled, was voiding without a catheter, and was ready to for discharge.  His nephrostomy tube was removed after KUB demonstrated significant reduction in his overall stone burden. There were 2 calcifications noted, one of which was likely intraparenchymal. A dressing was placed at his nephrostomy tube site.  Physical Exam  Constitutional: He is oriented to person, place, and time. He appears well-developed and well-nourished.  HENT:  Head: Normocephalic and atraumatic.  Eyes: EOM are normal. Pupils are equal, round, and reactive to light.  Neck: Normal range of motion. Neck supple.  Cardiovascular: Normal rate.   Pulmonary/Chest: Effort normal. No respiratory distress.  Abdominal: Soft. Bowel sounds are normal. He exhibits no distension.  Genitourinary: Penis normal.  Foley catheter light pink, removed this a.m.  Right nephrostomy tube light pink, clamped at 7:30 without pain, removed later today.  Wound site c/d/i.  Musculoskeletal: Normal range of motion.  Neurological: He is alert and oriented to person, place, and time.  Skin: Skin is warm and dry.  Psychiatric: He has a normal mood and affect.  Vitals reviewed.  Filed Vitals:   03/01/16 0441 03/01/16 1213  BP: 90/64 120/69  Pulse: 76 81  Temp: 98.4 F (36.9 C) 97.8 F (36.6 C)  Resp: 18 19      Laboratory values:   Recent Labs  02/29/16 1807 03/01/16 0322  HGB 13.3 11.9*  HCT 40.3 36.1*    Recent Labs  02/29/16 1807 03/01/16 0322  NA 139 135  K 4.2 4.3  CL 107 108  CO2 26 23  GLUCOSE 128* 119*  BUN 17 18  CREATININE 1.45* 1.27*  CALCIUM 8.3* 8.0*    Recent Labs  02/29/16 0620  INR 1.00   No results for input(s): LABURIN in the last 72 hours. Results for orders placed or performed during the hospital encounter of 02/18/16  Urine culture     Status: None   Collection Time: 02/18/16  9:46 AM  Result Value Ref Range Status   Specimen Description URINE, RANDOM  Final   Special Requests Normal  Final   Culture NO GROWTH 2 DAYS  Final   Report Status 02/20/2016 FINAL  Final    Disposition: Home  Discharge instruction: Change dressing as needed for saturation. He may shower with a dressing in place and replace it thereafter. He'll be given supplies for this. Your wound should continue to drain for a few days but slowly taper off.  Discharge medications:   Medication List    TAKE these medications        docusate sodium 100 MG capsule  Commonly known as:  COLACE  Take 1 capsule (100 mg total) by mouth 2 (two) times daily.     HYDROcodone-acetaminophen 5-325 MG tablet  Commonly  known as:  NORCO/VICODIN  Take 1-2 tablets by mouth every 6 (six) hours as needed for moderate pain.        Followup:      Follow-up Information    Follow up with Hollice Espy, MD In 2 weeks.   Specialty:  Urology   Why:  For wound re-check   Contact information:   [place] [address] [phone]

## 2016-03-01 NOTE — Discharge Instructions (Signed)
Percutaneous Nephrolithotomy °Kidney stones can cause a great deal of pain. They can block urine from leaving the body. And they can lead to infection. Kidney stones might pass on their own, or they can be broken up by shock waves from a special machine. But sometimes surgery is needed to get rid of kidney stones. One type of surgery is called percutaneous nephrolithotomy. "Nephro" means kidney, "litho" means stone and "tomy" means removal by surgery. "Percutaneous" means through the skin. This type of surgery needs only a small cut (incision) in the skin. °This surgery may be suggested for various reasons. They include: °· The kidney stones are 2 centimeters wide (about 3/4 inch) or bigger. They also might be oddly shaped. °· Other treatments were tried, but all or some of the kidney stones remain. °· Infection has developed. °· No other treatment can be used. °LET YOUR CAREGIVER KNOW ABOUT:  °· Any allergies. °· All medications you are taking, including: °¨ Herbs, eyedrops, over-the-counter medications and creams. °¨ Blood thinners (anticoagulants), aspirin or other drugs that could affect blood clotting. °· Use of steroids (by mouth or as creams). °· Previous problems with anesthetics, including local anesthetics. °· Possibility of pregnancy, if this applies. °· Any history of blood clots. °· Any history of bleeding or other blood problems. °· Previous surgery. °· Smoking history. °· Other health problems. °RISKS AND COMPLICATIONS  °· During surgery: °¨ Sometimes all the kidney stones cannot be removed through the tube. Then, the percutaneous nephrolithotomy procedure would be stopped. Open surgery would be used to remove the remaining stones. °· Short-term risks from the surgery could include: °¨ Excessive bleeding. °¨ Blood in your urine. °¨ Holes in the kidney. These usually heal on their own. °¨ Pain. °¨ Redness or tenderness at the incision site. °¨ Numbness (loss of feeling) in the area treated. Tingling  also is possible. °¨ A pooling of blood in the wound (hematoma). °¨ Infection. °¨ Slow healing. °· Longer-term possibilities include: °¨ Kidney damage. °¨ Damage to organs near the kidney. °¨ Need for a repeat surgery. °BEFORE THE PROCEDURE °· You may need to take some tests before your surgery. These might include: °¨ Blood tests. °¨ Urine tests. °¨ Tests to make sure your heart is working properly. °· Let your healthcare provider know if you think you might have a urinary tract infection. You will probably need to take an antibiotic to treat this before the surgery. °· Two weeks before your surgery, stop using aspirin and non-steroidal anti-inflammatory drugs (NSAIDs) for pain relief. This includes prescription drugs and over-the-counter drugs such as ibuprofen and naproxen. Also stop taking vitamin E. °· If you take blood-thinners, ask your healthcare provider when you should stop taking them. °· Do not eat or drink for about 8 hours before your surgery. °· You might be asked to shower or wash with a special antibacterial soap before the procedure. °· Arrive at least an hour before the surgery, or whenever your surgeon recommends. This will give you time to check in and fill out any needed paperwork. °PROCEDURE °· The preparation: °¨ You will change into a hospital gown. °¨ You will be given an IV. A needle will be inserted in your arm. Medication will be able to flow directly into your body through this needle. °¨ You might be given a sedative. This medication will help you relax. °¨ You may be given a general anesthetic (a drug that will put you to sleep during the surgery). Or, you may   get a local anesthetic (part of your body will be numb, but you will remain awake). °¨ A catheter (tube) will be put in your bladder to drain urine during and after surgery. °· The procedure: °¨ The surgeon will make a small incision in your lower back. °¨ A tube will be inserted through the incision into your kidney. °¨ Each  kidney stone is removed through this tube. Larger stones may first be broken up with a laser (a high-intensity light beam) or other tools. °¨ If a kidney stone has already left the kidney, the surgeon would use a special tool to bring it back in. Then it would be removed through the tube. °¨ After all the stones are taken out, a catheter will be put in. Fluid can build up around the kidney as it heals. The catheter lets this fluid drain out of the body. °¨ A dressing (medicine and bandage) will be put on the incision area. °¨ The surgery usually takes three to four hours. °AFTER THE PROCEDURE °· You will stay in a recovery area until the anesthesia has worn off. Your blood pressure and pulse will be checked often. You might be given more pain medication. °· You will be moved to a hospital room for the rest of your stay. °· The catheter that is taking urine out of your body will be taken out within 24 hours. °· The day after your surgery, you should be able to walk around. Walking helps prevent blood clots (thick clumps that can block the flow of blood). °· You may be asked to do some breathing exercises. °· You will be able to have only liquids for a day or two. °· Before you go home: °¨ The catheter that is draining fluid from the kidney area is usually taken out. °¨ You will be taught how to care for the incision. Be sure to ask how often the dressing should be changed and when it can get wet. °¨ You also will be told what you should and should not do while your kidney and incisions heal. For instance, you may be urged to walk to prevent blood clots. °  °This information is not intended to replace advice given to you by your health care provider. Make sure you discuss any questions you have with your health care provider. °  °Document Released: 10/02/2009 Document Revised: 12/26/2014 Document Reviewed: 06/01/2015 °Elsevier Interactive Patient Education ©2016 [employer] ° °

## 2016-03-01 NOTE — Progress Notes (Signed)
03/01/2016  14:15  [name] to be D/C'd Home per MD order.  Discussed prescriptions and follow up appointments with the patient. Prescriptions given to patient, medication list explained in detail. Pt verbalized understanding.    Medication List    TAKE these medications        docusate sodium 100 MG capsule  Commonly known as:  COLACE  Take 1 capsule (100 mg total) by mouth 2 (two) times daily.     HYDROcodone-acetaminophen 5-325 MG tablet  Commonly known as:  NORCO/VICODIN  Take 1-2 tablets by mouth every 6 (six) hours as needed for moderate pain.        Filed Vitals:   03/01/16 0441 03/01/16 1213  BP: 90/64 120/69  Pulse: 76 81  Temp: 98.4 F (36.9 C) 97.8 F (36.6 C)  Resp: 18 19    Skin clean, dry and intact without evidence of skin break down, no evidence of skin tears noted. IV catheter discontinued intact. Site without signs and symptoms of complications. Dressing and pressure applied. Pt denies pain at this time. No complaints noted.  An After Visit Summary was printed and given to the patient. Patient escorted via [place], and D/C home via private auto.  [name]

## 2016-03-02 NOTE — Anesthesia Postprocedure Evaluation (Signed)
Anesthesia Post Note  Patient: Christian Pace  Procedure(s) Performed: Procedure(s) (LRB): NEPHROLITHOTOMY PERCUTANEOUS cystoscopy right stent removal (N/A)  Patient location during evaluation: PACU Anesthesia Type: General Level of consciousness: awake and alert and oriented Pain management: pain level controlled Vital Signs Assessment: post-procedure vital signs reviewed and stable Respiratory status: spontaneous breathing Cardiovascular status: blood pressure returned to baseline Anesthetic complications: no    Last Vitals:  Filed Vitals:   03/01/16 0441 03/01/16 1213  BP: 90/64 120/69  Pulse: 76 81  Temp: 36.9 C 36.6 C  Resp: 18 19    Last Pain:  Filed Vitals:   03/01/16 1214  PainSc: 4                  Fransisco Messmer

## 2016-03-04 LAB — STONE ANALYSIS
CA OXALATE, MONOHYDR.: 97 %
Ca phos cry stone ql IR: 3 %
Stone Weight KSTONE: 627 mg

## 2016-03-15 ENCOUNTER — Ambulatory Visit: Payer: BLUE CROSS/BLUE SHIELD | Admitting: Urology

## 2016-03-15 ENCOUNTER — Encounter: Payer: Self-pay | Admitting: Urology

## 2016-04-13 ENCOUNTER — Encounter: Payer: Self-pay | Admitting: Urology

## 2016-04-13 ENCOUNTER — Ambulatory Visit (INDEPENDENT_AMBULATORY_CARE_PROVIDER_SITE_OTHER): Payer: BLUE CROSS/BLUE SHIELD | Admitting: Urology

## 2016-04-13 VITALS — BP 128/79 | HR 78 | Ht 72.0 in | Wt 198.0 lb

## 2016-04-13 DIAGNOSIS — N183 Chronic kidney disease, stage 3 unspecified: Secondary | ICD-10-CM

## 2016-04-13 DIAGNOSIS — N200 Calculus of kidney: Secondary | ICD-10-CM

## 2016-04-13 DIAGNOSIS — H6192 Disorder of left external ear, unspecified: Secondary | ICD-10-CM

## 2016-04-13 NOTE — Progress Notes (Signed)
04/13/2016 5:08 PM   Christian Pace 05/08/1971 [id number]  Referring provider: No referring provider defined for this encounter.  Chief Complaint  Patient presents with  . Follow-up    NEPHROLITHOTOMY PERCUTANEOUS     HPI: 45 year old male with extensive bilateral nephrolithiasis who underwent staged left ureteroscopy for treatment of his left-sided stones followed more recently by right PNCL/ stent removal.    He has been doing well since his last surgery.  Leaked from nephrostomy tube site x several days but none since.  No residual pain.  He has passed a small stone since surgery.    Follow up KUB in the hospital following PNCL shows significant improvement of his bilateral ureteral stones but with some residual stones which were not assessable with the access obtained.    History of renal stones x 20 years. No previous stone interventions. Reports that he thinks he has passed approximately 6 stones spontaneously.  Stone analysis shows stone composition 90% calcium oxalate monohydrate, 10% calcium phosphate.  Initially on presentation, his BMP was noted to be upright with elevated creatinine to 2.41. This is since improved to 1.3 for which is his baseline.  He has been drinking tons of water and brings a bottle of water with him today.     PMH: Past Medical History  Diagnosis Date  . Renal disorder   . Kidney stones     Surgical History: Past Surgical History  Procedure Laterality Date  . Hernia repair    . Ureteroscopy with holmium laser lithotripsy Left 12/02/2015    Procedure: URETEROSCOPY WITH HOLMIUM LASER LITHOTRIPSY WITH RETROGRADE PYLOGRAM;  Surgeon: Hollice Espy, MD;  Location: [facility];  Service: Urology;  Laterality: Left;  . Cystoscopy with stent placement Bilateral 12/02/2015    Procedure: CYSTOSCOPY WITH STENT PLACEMENT,bilateral retrograde pylorograms;  Surgeon: Hollice Espy, MD;  Location: [facility];  Service: Urology;  Laterality: Bilateral;  .  Ureteroscopy with holmium laser lithotripsy Left 01/04/2016    Procedure: URETEROSCOPY WITH HOLMIUM LASER LITHOTRIPSY;  Surgeon: Hollice Espy, MD;  Location: [facility];  Service: Urology;  Laterality: Left;  . Cystoscopy w/ ureteral stent placement Bilateral 01/04/2016    Procedure: CYSTOSCOPY WITH STENT REPLACEMENT;  Surgeon: Hollice Espy, MD;  Location: [facility];  Service: Urology;  Laterality: Bilateral;  . Nephrolithotomy N/A 02/29/2016    Procedure: NEPHROLITHOTOMY PERCUTANEOUS cystoscopy right stent removal;  Surgeon: Hollice Espy, MD;  Location: [facility];  Service: Urology;  Laterality: N/A;    Home Medications:    Medication List       This list is accurate as of: 04/13/16  5:08 PM.  Always use your most recent med list.               docusate sodium 100 MG capsule  Commonly known as:  COLACE  Take 1 capsule (100 mg total) by mouth 2 (two) times daily.     HYDROcodone-acetaminophen 5-325 MG tablet  Commonly known as:  NORCO/VICODIN  Take 1-2 tablets by mouth every 6 (six) hours as needed for moderate pain.        Allergies:  Allergies  Allergen Reactions  . Fentanyl Other (See Comments)    Change in mentation for long period of time    Family History: No family history on file.  Social History:  reports that he has never smoked. He does not have any smokeless tobacco history on file. He reports that he drinks alcohol. He reports that he does not use illicit drugs.   Physical  Exam: BP 128/79 mmHg  Pulse 78  Ht 6' (1.829 m)  Wt 198 lb (89.812 kg)  BMI 26.85 kg/m2  Constitutional:  Alert and oriented, No acute distress. HEENT: [place] AT, moist mucus membranes.  Trachea midline, no masses. Cardiovascular: No clubbing, cyanosis, or edema. Respiratory: Normal respiratory effort, no increased work of breathing. GI: Abdomen is soft, nontender, nondistended, no abdominal masses GU: No CVA tenderness. Right PNCL incision well healed.   Skin: No rashes, bruises or  suspicious lesions. Neurologic: Grossly intact, no focal deficits, moving all 4 extremities. Psychiatric: Normal mood and affect.  Laboratory Data: Lab Results  Component Value Date   WBC 8.1 02/18/2016   HGB 11.9* 03/01/2016   HCT 36.1* 03/01/2016   MCV 85.4 02/18/2016   PLT 272 02/18/2016    Lab Results  Component Value Date   CREATININE 1.27* 03/01/2016   Pertinent Imaging: KUB ordered today  Assessment & Plan:    1. Bilateral nephrolithiasis KUB today vs. Prior to next visit Extensive bilateral stones s/p staged left URS and R PNCL Not completely stone free but dramatically improved Will likely need repeat procedure in future but will give kidneys a break for now  2. Metabolic stone diease We discussed general stone prevention techniques including drinking plenty water with goal of producing 2.5 L urine daily, increased citric acid intake, avoidance of high oxalate containing foods, and decreased salt intake.  Information about dietary recommendations given today.   I have recommended Litholink 24 hour urine metabolic work up- instruction given  3. CKD Baseline creatinine appears to be 1.3-1.4  In absence of obstruction Will need nephrology referral if not improved on repeat labs at time of litholink  4.  Left ear lesion Has not followed up with Derm Stress importance of this - contact info given again today  Return in about 4 weeks (around 05/11/2016) for KUB, review 24 hour urine.  Hollice Espy, MD  [facility] [address] [place], [place] [address] [phone]

## 2016-09-04 IMAGING — CT CT RENAL STONE PROTOCOL
3 of 4 series · 9 of 46 positions shown, 16 images · non-contrast
Comparison: CT of the abdomen and pelvis 05/04/2013.

CLINICAL DATA: 44-year-old male with right-sided flank pain since 4
a.m.. Nausea. Prior history of kidney stones. History of bilateral
inguinal hernia repair.

EXAM:
CT ABDOMEN AND PELVIS WITHOUT CONTRAST
TECHNIQUE: Multidetector CT imaging of the abdomen and pelvis was performed
following the standard protocol without IV contrast.

[Series 4: lung · axial · 0.77mm/px · z∈[-421,-321]mm · 5 of 31 slices shown, 10 images]
[im 6/31  soft-tissue]
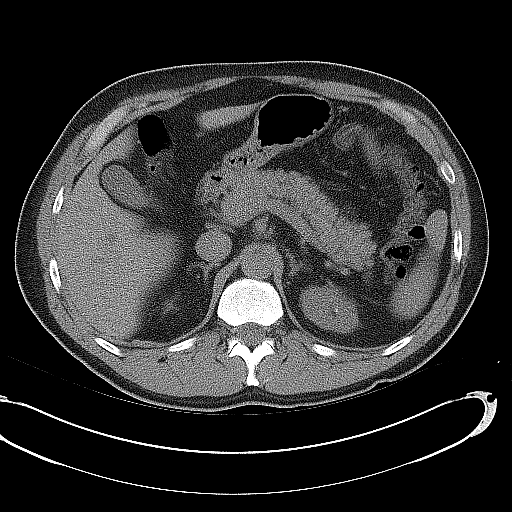
[im 6/31  bone]
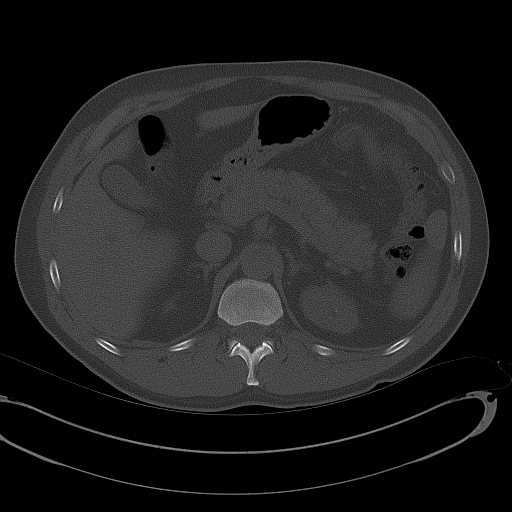
[im 11/31  soft-tissue]
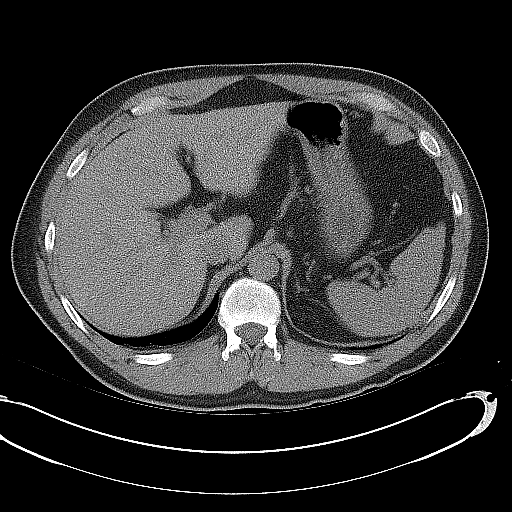
[im 11/31  lung]
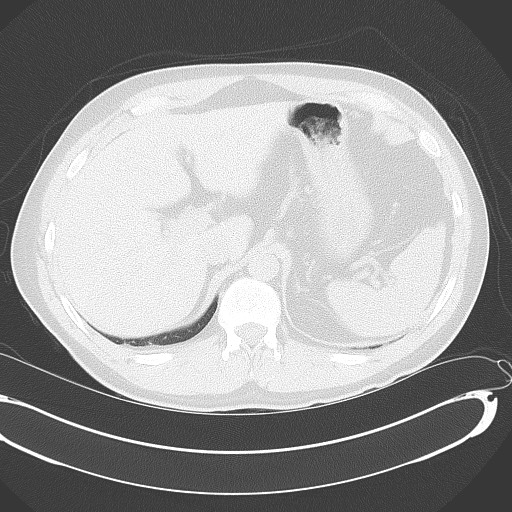
[im 16/31  soft-tissue]
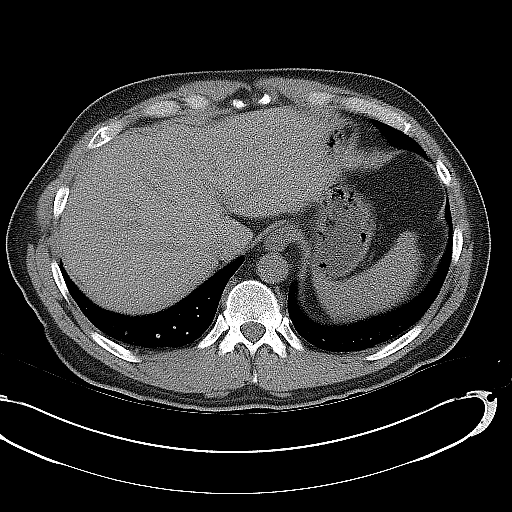
[im 16/31  lung]
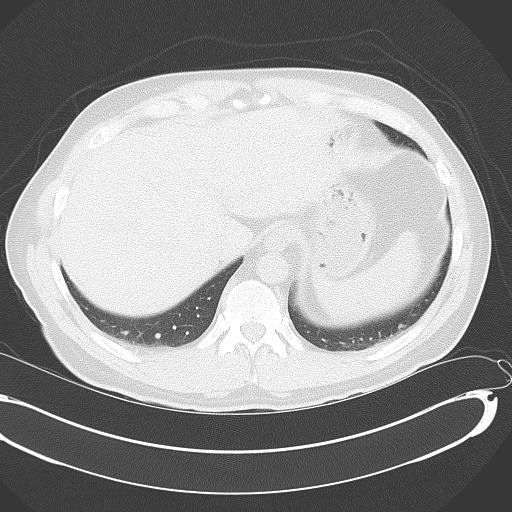
[im 21/31  soft-tissue]
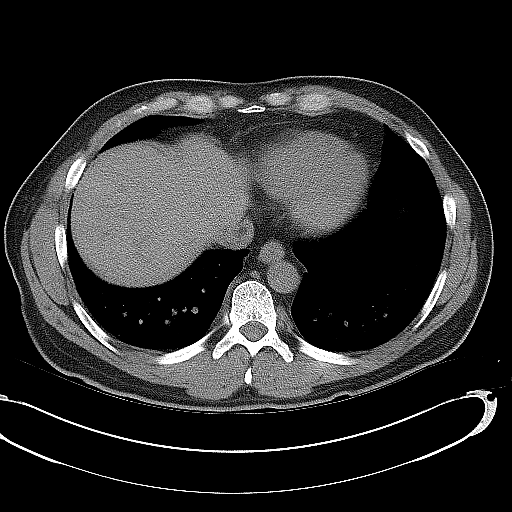
[im 21/31  lung]
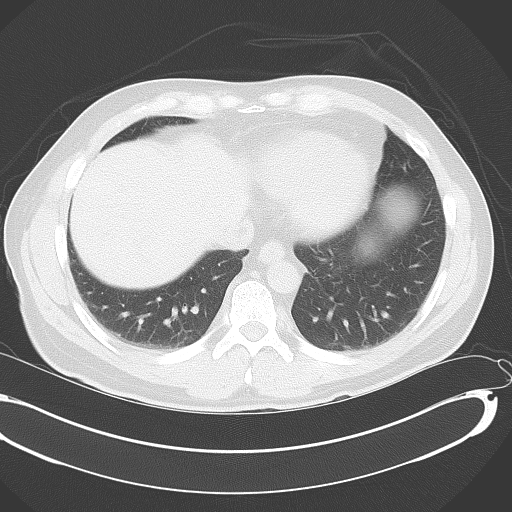
[im 26/31  soft-tissue]
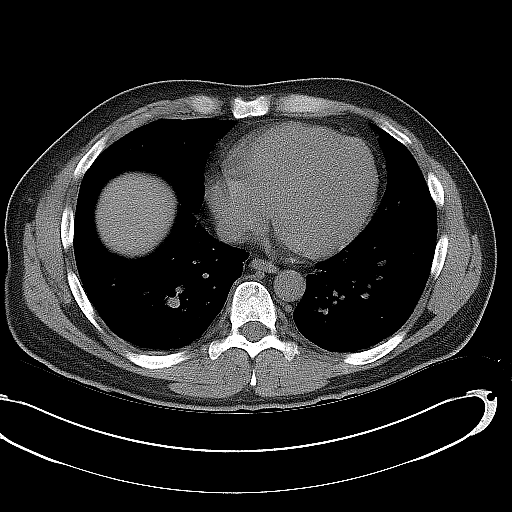
[im 26/31  lung]
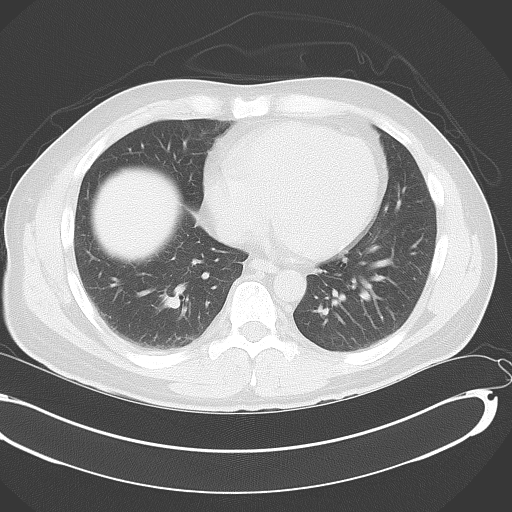

[Series 5: coronal · coronal · 0.79mm/px · 3 of 143 slices shown, 4 images]
[im 48/143  soft-tissue]
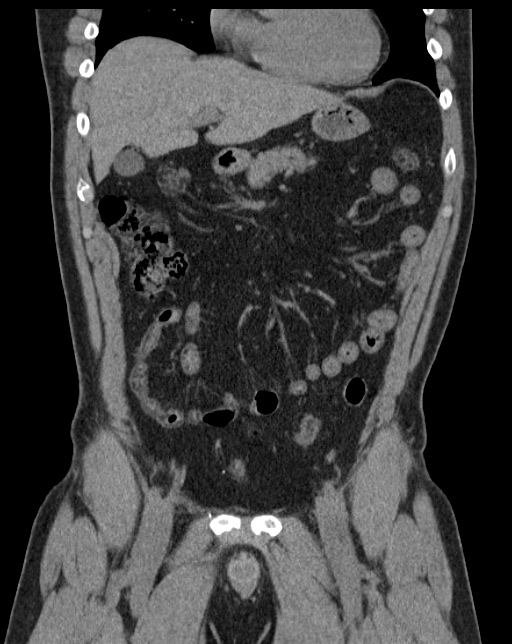
[im 64/143  soft-tissue]
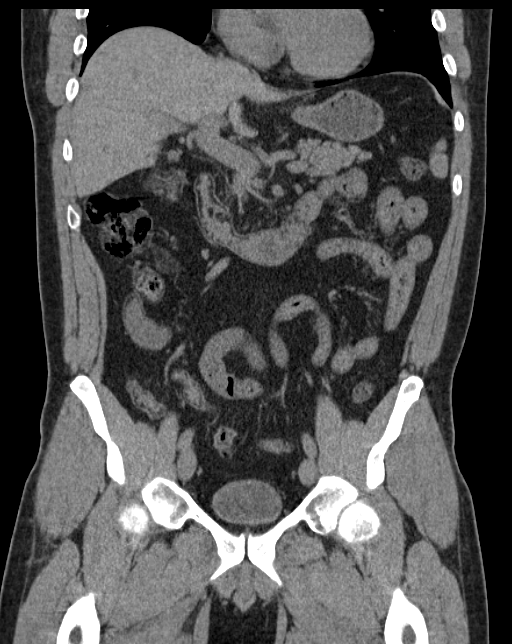
[im 64/143  bone]
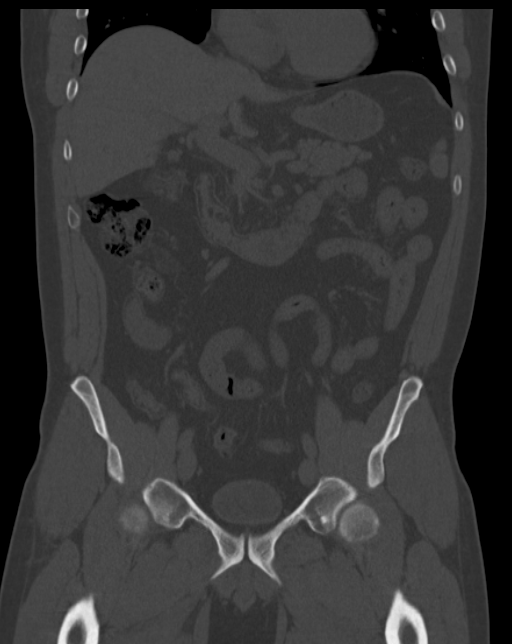
[im 79/143  soft-tissue]
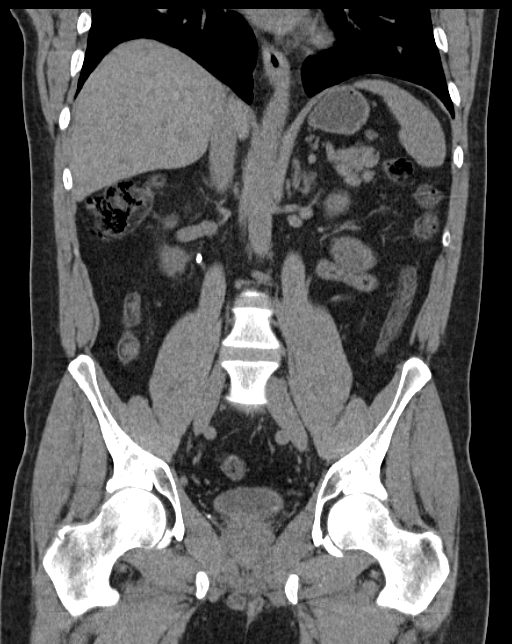

[Series 6: sagittal · sagittal · 0.59mm/px · 1 of 193 slices shown, 2 images]
[im 65/193  soft-tissue]
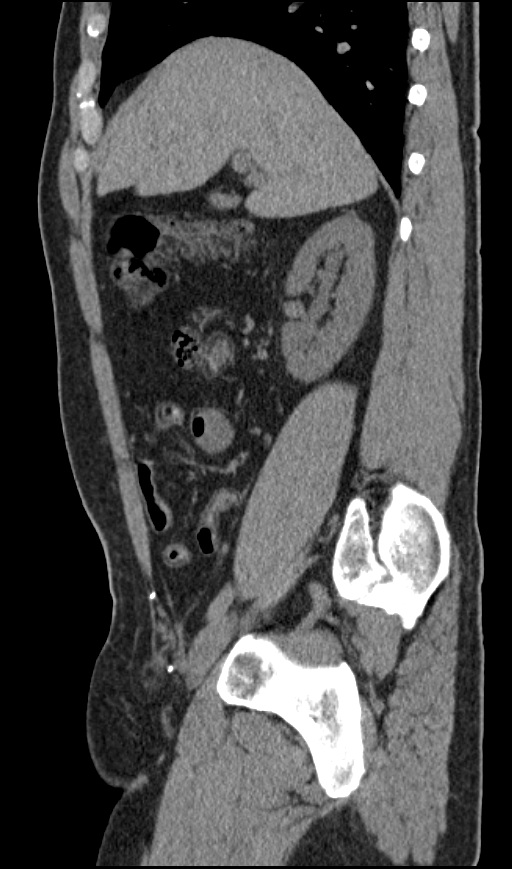
[im 65/193  bone]
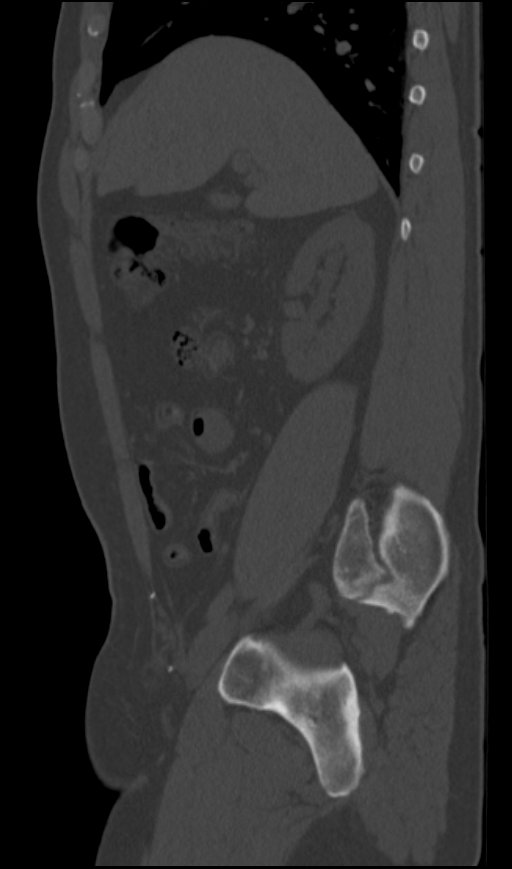

[9 of 46 positions shown; findings below may reference images not displayed]

FINDINGS: Lower chest:  Unremarkable.

Hepatobiliary: No definite cystic or solid hepatic lesions are
identified on today's noncontrast CT examination. Unenhanced
appearance of the gallbladder is normal.

Pancreas: No pancreatic mass or peripancreatic inflammatory changes
on today's noncontrast CT examination.

Spleen: Unremarkable.

Adrenals/Urinary Tract: There are numerous nonobstructive calculi in
the collecting systems of the kidneys bilaterally measuring up to
1.3 cm in the upper pole of the left kidney. In addition, there is a
10 mm calculus at the right ureteropelvic junction. There is also an
8 mm calculus at the left ureterovesicular junction. Neither of
these are associated with significant proximal
hydroureteronephrosis, although there is some very mild fullness in
the right renal collecting system. No bladder calculi. Bilateral
adrenal glands are normal in appearance.

Stomach/Bowel: Normal appearance of the stomach. No pathologic
dilatation of small bowel or colon. Normal appendix.

Vascular/Lymphatic: No significant atherosclerotic calcifications or
definite aneurysm identified in the abdominal or pelvic vasculature.
No lymphadenopathy noted in the abdomen or pelvis on today's
noncontrast CT examination.

Reproductive: Prostate gland and seminal vesicles are unremarkable
in appearance.

Other: Postoperative changes of bilateral inguinal herniorrhaphy are
noted. No significant volume of ascites. No pneumoperitoneum.

Musculoskeletal: There are no aggressive appearing lytic or blastic
lesions noted in the visualized portions of the skeleton.
IMPRESSION: 1. In addition to numerous nonobstructive calculi in the collecting
systems of the kidneys bilaterally, the patient has a 10 mm calculus
at the right ureteropelvic junction, and a 8 mm calculus at the left
ureterovesicular junction. At this time, there is only mild fullness
in the right renal collecting system, which could suggest very mild
obstruction on the right side, but there is no associated left-sided
hydroureteronephrosis.
2. Normal appendix.
3. Status post bilateral inguinal herniorrhaphy.

## 2016-12-07 IMAGING — CR DG NEPHROSTOGRAM EXISTING ACCESS RIGHT
2 series · 2 of 2 positions shown · non-contrast
Comparison: Imaging during right percutaneous nephrostomy procedure
performed prior to the operative nephrolithotomy.

CLINICAL DATA: Right-sided percutaneous nephrolithotomy.

EXAM:
DG NEPHROSTOGRAM EXISTING ACCESS RIGHT

[pulsed (1 of 2)]
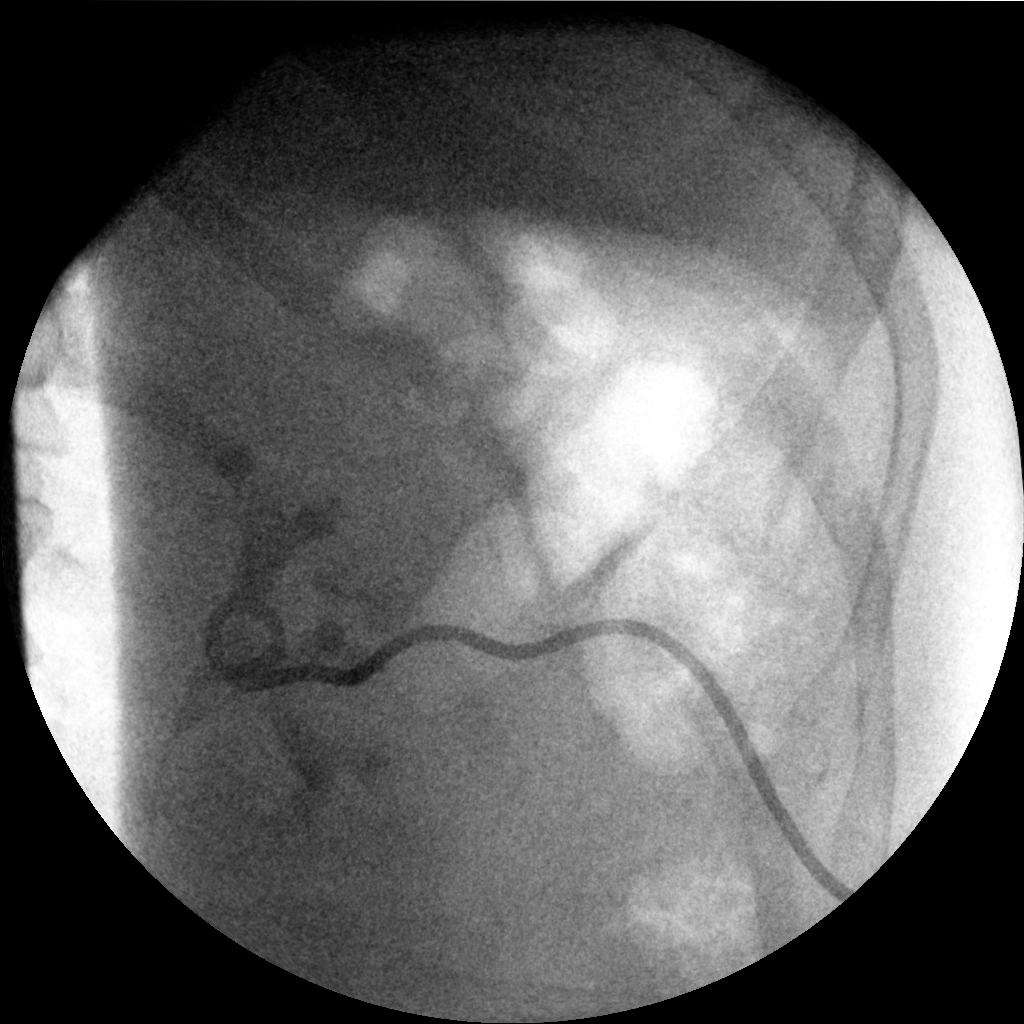

[pulsed (2 of 2)]
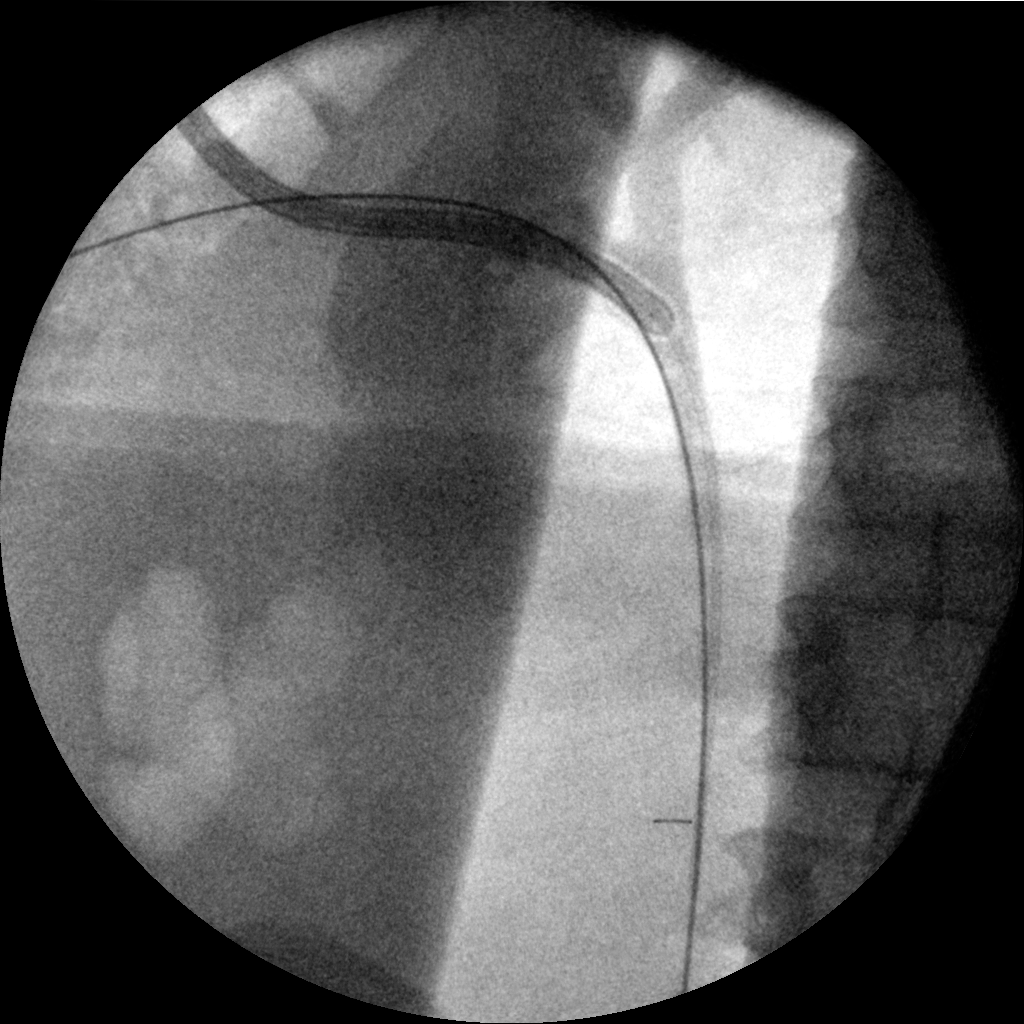

[2 of 2 positions shown; findings below may reference images not displayed]

FINDINGS: Intraoperative imaging was performed with a C-arm. A pre-existing
right nephrostomy tube was injected with contrast material to
opacify the collecting system. Access was obtained down the right
ureter with completion imaging demonstrating a guidewire, catheter
and large bore nephrostomy tube.
IMPRESSION: Imaging during operative right-sided percutaneous nephrolithotomy.

## 2016-12-07 IMAGING — US US INTRAOPERATIVE - NO REPORT
1 series · 2 of 2 positions shown · non-contrast
Comparison: CT scan of November 27, 2015.

INDICATION: Right-sided kidney stones.

EXAM:
IR NEPHROSTOMY PLACEMENT RIGHT; ULTRAOUND INTRAOPERATIVE - NRPT [facility]

[Series 1: us intraoperative - no report · 0.20mm/px · 2 of 2 slices shown]
[im 1/2]
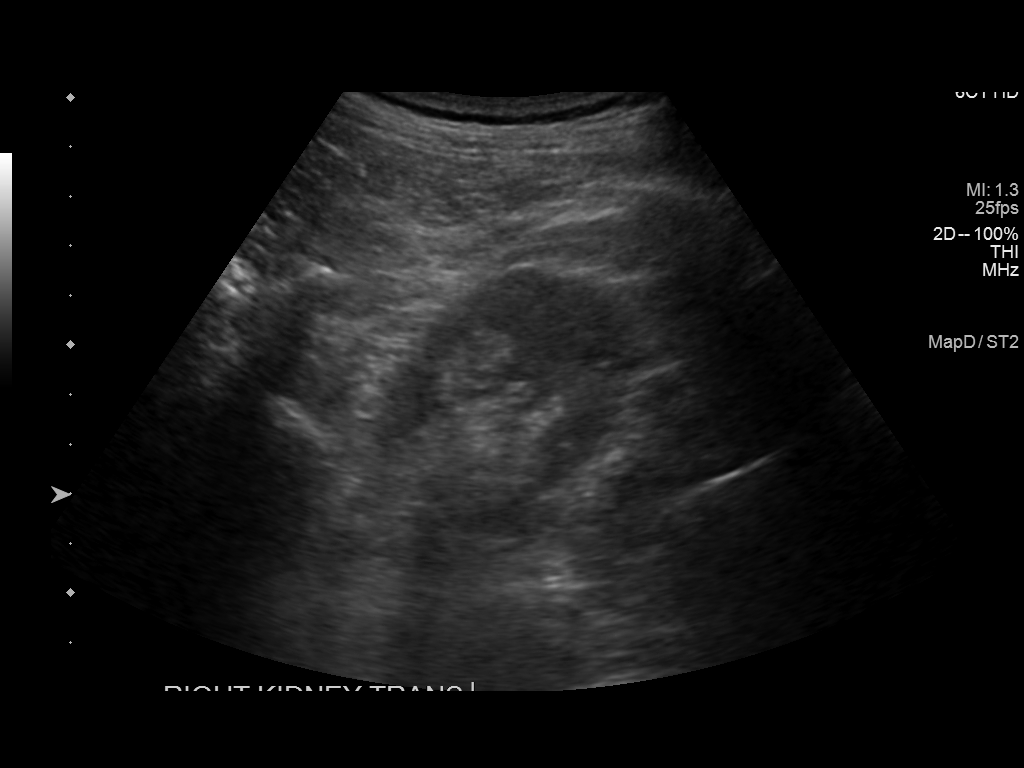
[im 2/2]
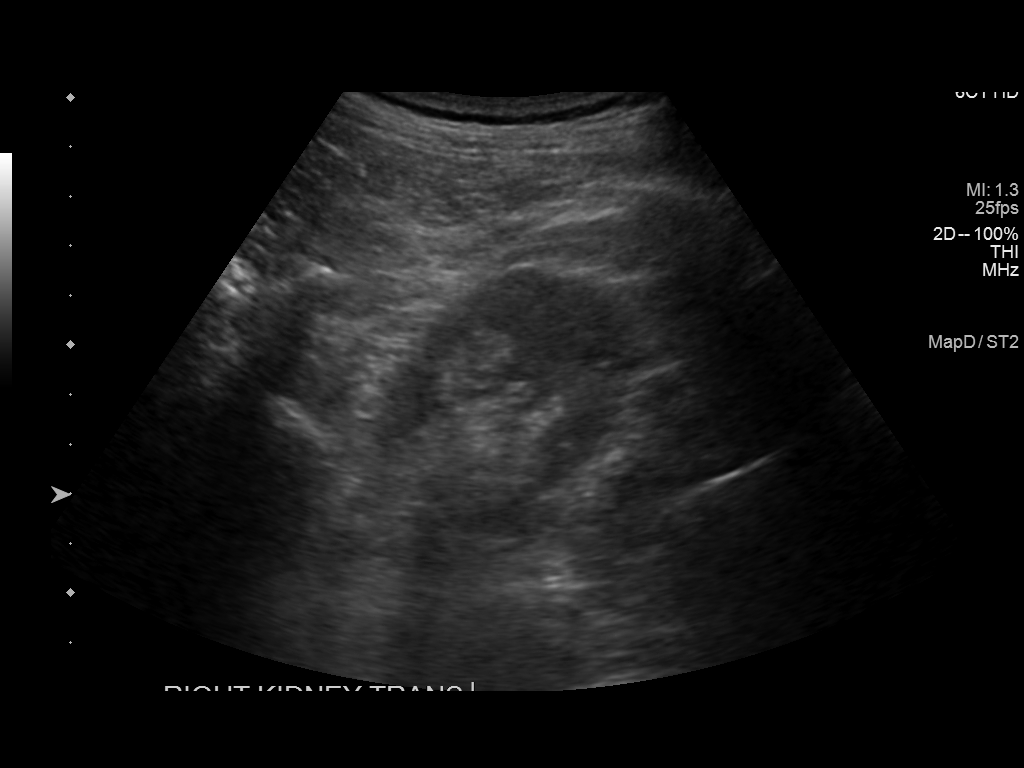

[2 of 2 positions shown; findings below may reference images not displayed]

MEDICATIONS:
1 g Ancef IV; The antibiotic was administered in an appropriate time
frame prior to skin puncture.

ANESTHESIA/SEDATION:
Versed 5 mg IV, 0.5 mg Dilaudid.

Moderate Sedation Time:  1 hour 30 minutes.

The patient was continuously monitored during the procedure by the
interventional radiology nurse under my direct supervision.

CONTRAST:  35mL OMNIPAQUE IOHEXOL 300 MG/ML SOLN - administered into
the collecting system(s)

FLUOROSCOPY TIME:  Fluoroscopy Time: 13 minutes 42 seconds.

COMPLICATIONS:
None immediate.

PROCEDURE:
Informed written consent was obtained from the patient after a
thorough discussion of the procedural risks, benefits and
alternatives. All questions were addressed. Maximal Sterile Barrier
Technique was utilized including caps, mask, sterile gowns, sterile
gloves, sterile drape, hand hygiene and skin antiseptic. A timeout
was performed prior to the initiation of the procedure.

Right flank was prepped and draped using sterile technique. Local
anesthetic was applied. Ultrasound guidance was used to initially
determine the position of the right kidney. Confirmatory images was
obtained and saved. Under fluoroscopic guidance, multiple attempts
were made to direct needle toward calculi in the lower pole
collecting system. These were unsuccessful. After discussion with
Dr. Shalley, the decision was made to attempt access into the
midpole calyx. Using local anesthetic and fluoroscopic guidance, 21
gauge needle was directed toward a midpole calyx. Contrast was
injected which demonstrates filling of the collecting system.
Guidewire was placed followup by a transition dilator. Then, larger
guidewire was placed followup by 8 French dilator. Then, a point
French nephrostomy catheter was passed over wire and its internal
loop with secured within the right renal pelvis. Contrast was
injected through the catheter to to confirm its position within the
right renal pelvis. The external portion of the catheter was secured
and hooked up to external drainage.
IMPRESSION: Under ultrasound and fluoroscopic guidance, 8.5 French nephrostomy
catheter was placed through middle pole calyx and internally and
externally secured, in preparation for percutaneous nephrolithotomy.

## 2016-12-07 IMAGING — XA IR NEPHROSTOMY PLACEMENT RIGHT
9 series · 9 of 9 positions shown · non-contrast
Comparison: CT scan of November 27, 2015.

INDICATION: Right-sided kidney stones.

EXAM:
IR NEPHROSTOMY PLACEMENT RIGHT; ULTRAOUND INTRAOPERATIVE - [facility]PT [facility]

[Series 1: single · 1 of 1 slices shown (1 of 8)]
[im 1/1]
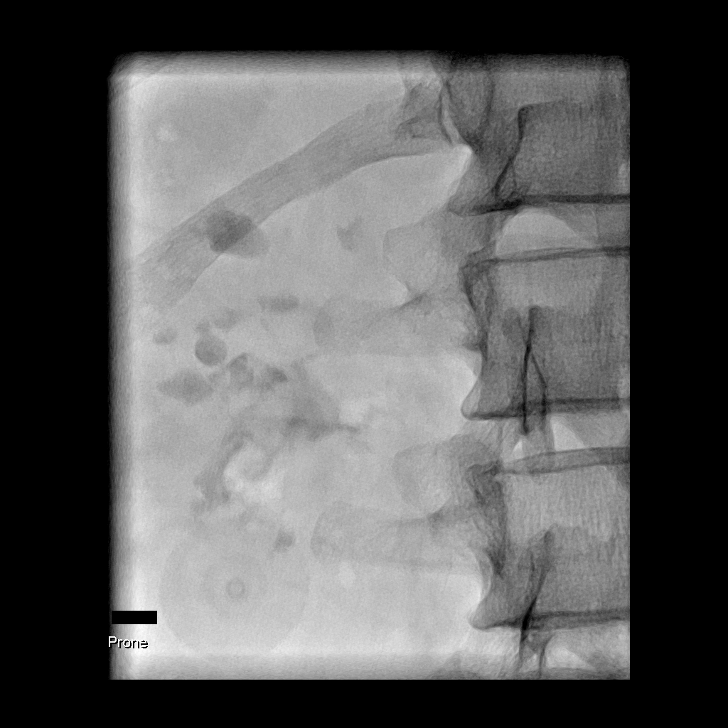

[Series 2: single · 1 of 1 slices shown (2 of 8)]
[im 1/1]
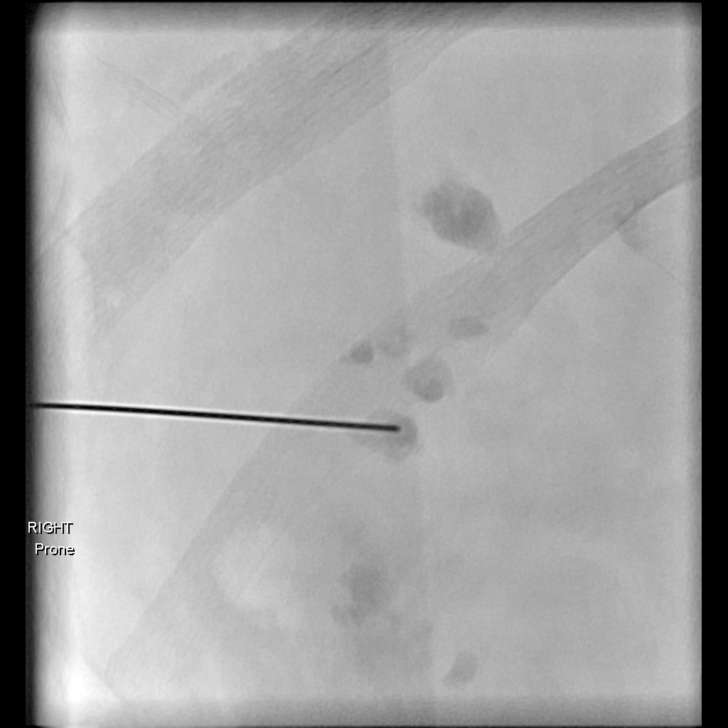

[Series 3: single · 1 of 1 slices shown (3 of 8)]
[im 1/1]
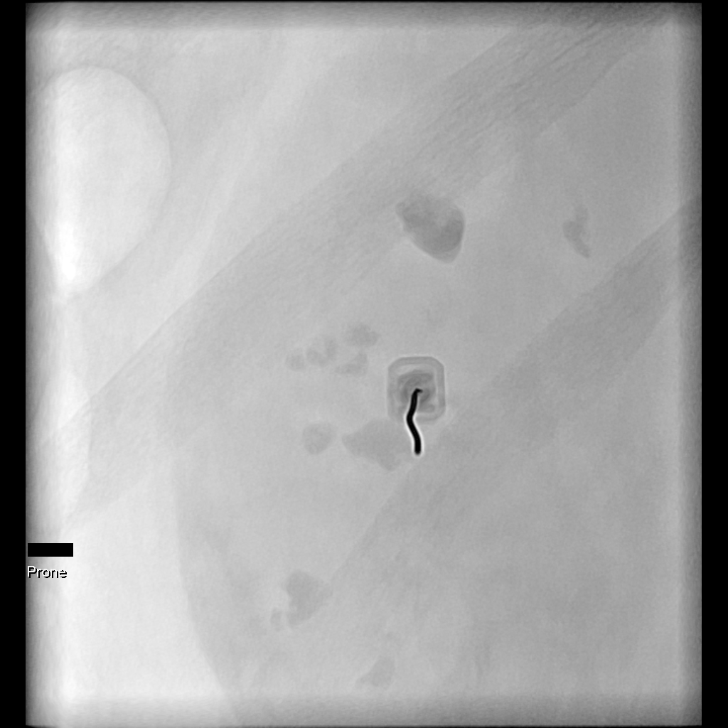

[Series 4: single · 1 of 1 slices shown (4 of 8)]
[im 1/1]
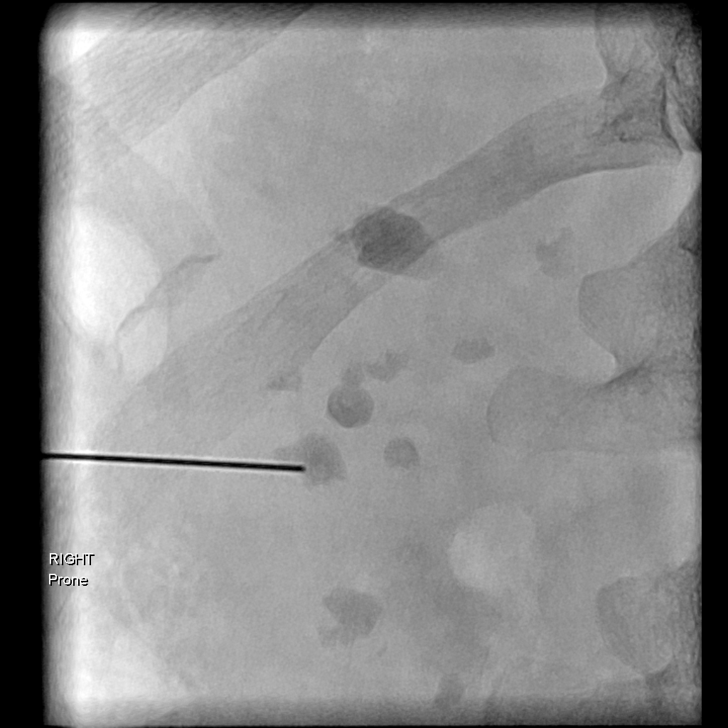

[Series 5: single · 1 of 1 slices shown (5 of 8)]
[im 1/1]
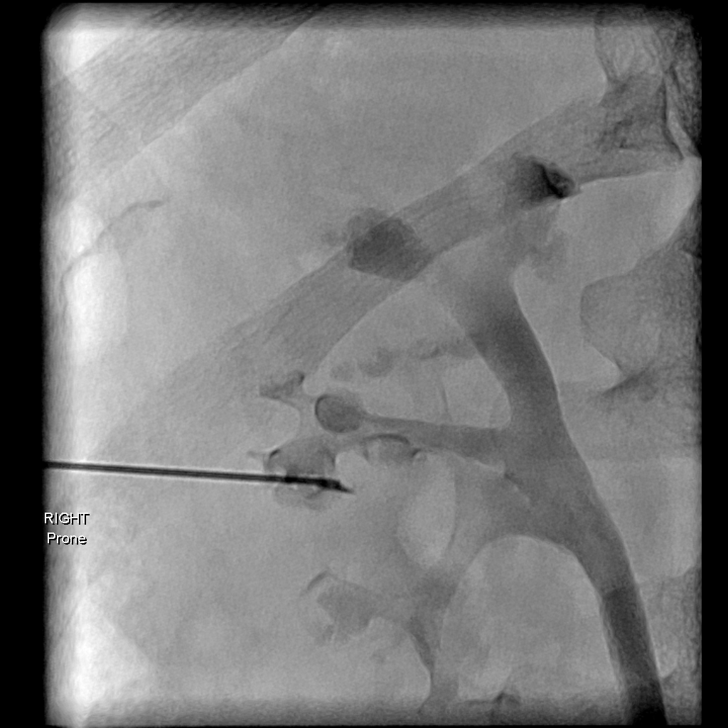

[Series 6: single · 1 of 1 slices shown (6 of 8)]
[im 1/1]
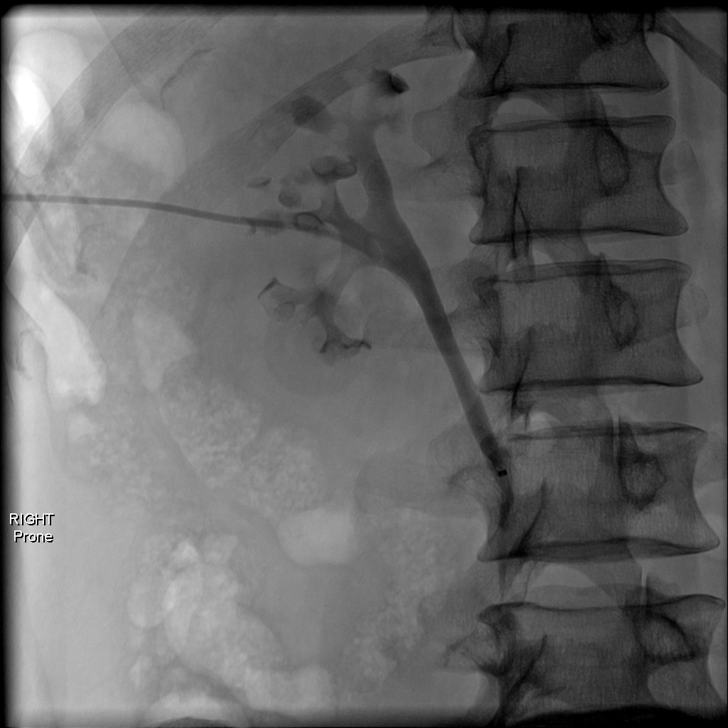

[Series 7: single · 1 of 1 slices shown (7 of 8)]
[im 1/1]
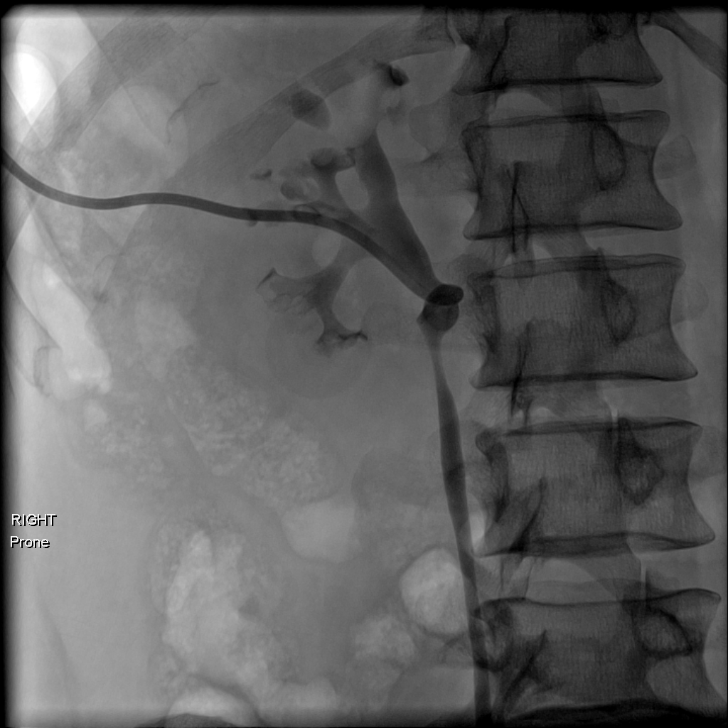

[Series 8: single · 1 of 1 slices shown (8 of 8)]
[im 1/1]
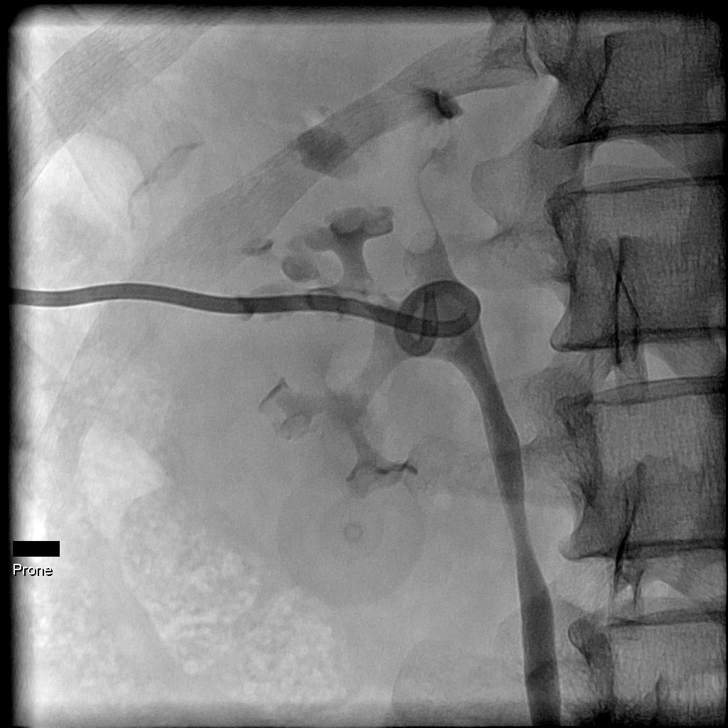

[Series 9: fl - angio · 1 of 1 slices shown]
[im 1/1]
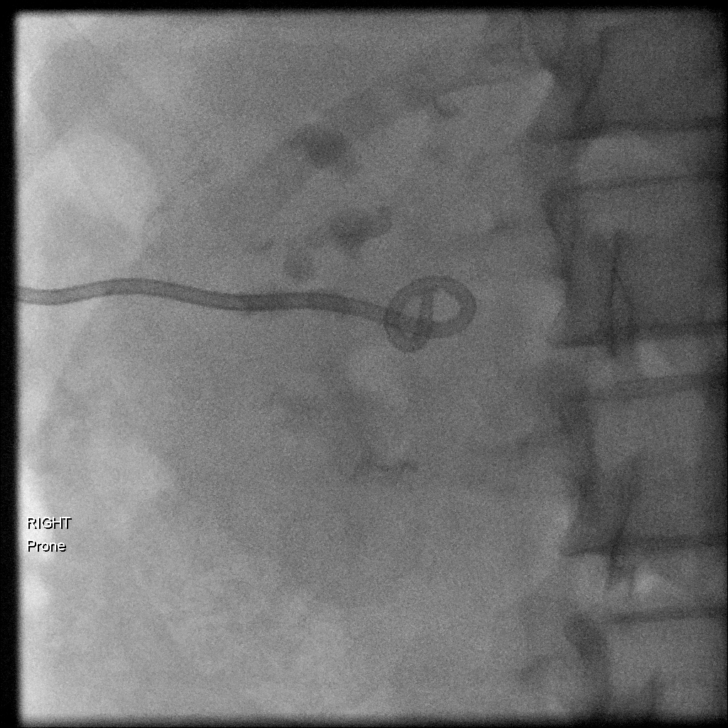

[9 of 9 positions shown; findings below may reference images not displayed]

MEDICATIONS:
1 g Ancef IV; The antibiotic was administered in an appropriate time
frame prior to skin puncture.

ANESTHESIA/SEDATION:
Versed 5 mg IV, 0.5 mg Dilaudid.

Moderate Sedation Time:  1 hour 30 minutes.

The patient was continuously monitored during the procedure by the
interventional radiology nurse under my direct supervision.

CONTRAST:  35mL OMNIPAQUE IOHEXOL 300 MG/ML SOLN - administered into
the collecting system(s)

FLUOROSCOPY TIME:  Fluoroscopy Time: 13 minutes 42 seconds.

COMPLICATIONS:
None immediate.

PROCEDURE:
Informed written consent was obtained from the patient after a
thorough discussion of the procedural risks, benefits and
alternatives. All questions were addressed. Maximal Sterile Barrier
Technique was utilized including caps, mask, sterile gowns, sterile
gloves, sterile drape, hand hygiene and skin antiseptic. A timeout
was performed prior to the initiation of the procedure.

Right flank was prepped and draped using sterile technique. Local
anesthetic was applied. Ultrasound guidance was used to initially
determine the position of the right kidney. Confirmatory images was
obtained and saved. Under fluoroscopic guidance, multiple attempts
were made to direct needle toward calculi in the lower pole
collecting system. These were unsuccessful. After discussion with
Dr. Shalley, the decision was made to attempt access into the
midpole calyx. Using local anesthetic and fluoroscopic guidance, 21
gauge needle was directed toward a midpole calyx. Contrast was
injected which demonstrates filling of the collecting system.
Guidewire was placed followup by a transition dilator. Then, larger
guidewire was placed followup by 8 French dilator. Then, a point
French nephrostomy catheter was passed over wire and its internal
loop with secured within the right renal pelvis. Contrast was
injected through the catheter to to confirm its position within the
right renal pelvis. The external portion of the catheter was secured
and hooked up to external drainage.
IMPRESSION: Under ultrasound and fluoroscopic guidance, 8.5 French nephrostomy
catheter was placed through middle pole calyx and internally and
externally secured, in preparation for percutaneous nephrolithotomy.

## 2016-12-08 IMAGING — CR DG ABDOMEN 1V
1 series · 2 of 2 positions shown · non-contrast
Comparison: 02/29/2016

CLINICAL DATA: Status post PCNL

EXAM:
ABDOMEN - 1 VIEW

[Series 1: dg abd 1 view · 0.14mm/px · 2 of 2 slices shown]
[im 1/2]
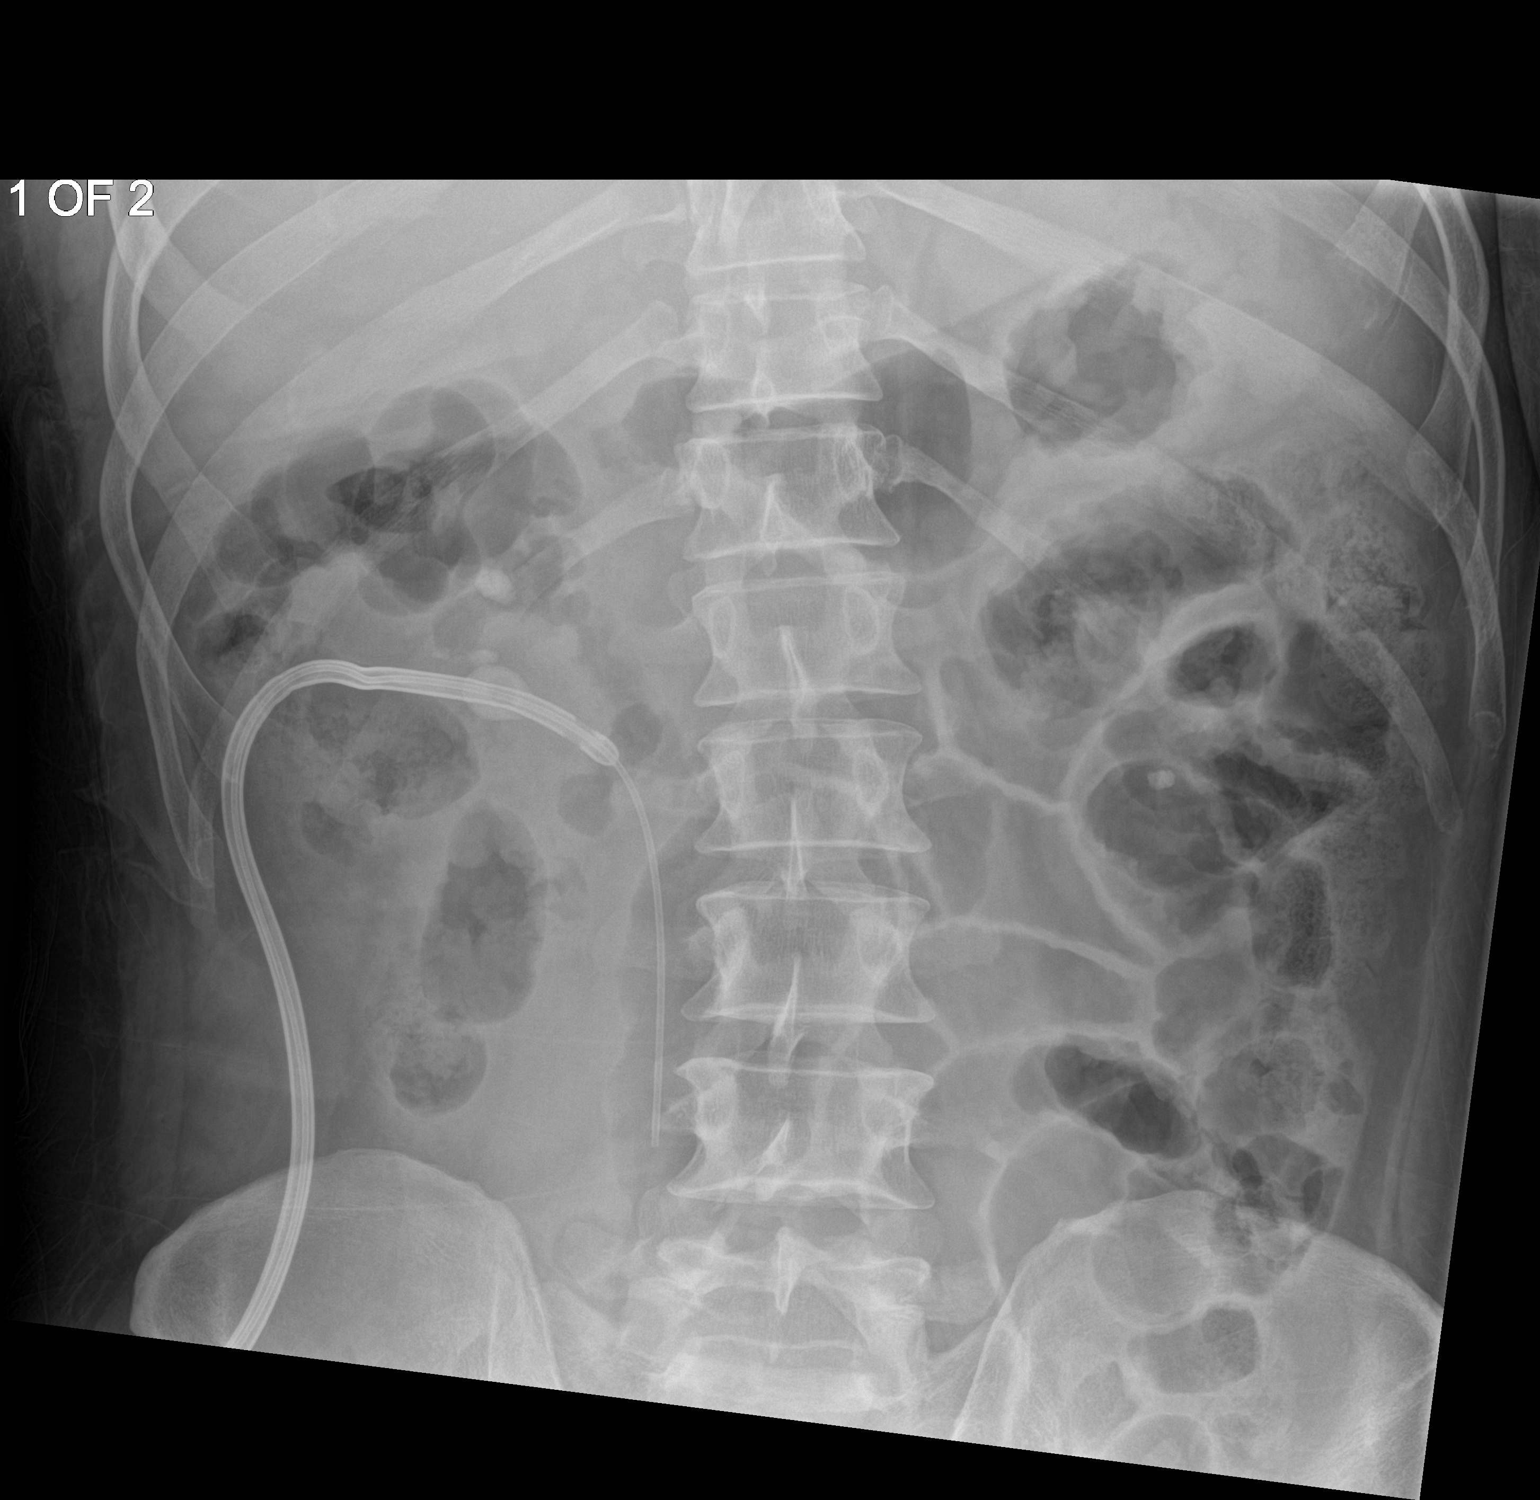
[im 2/2]
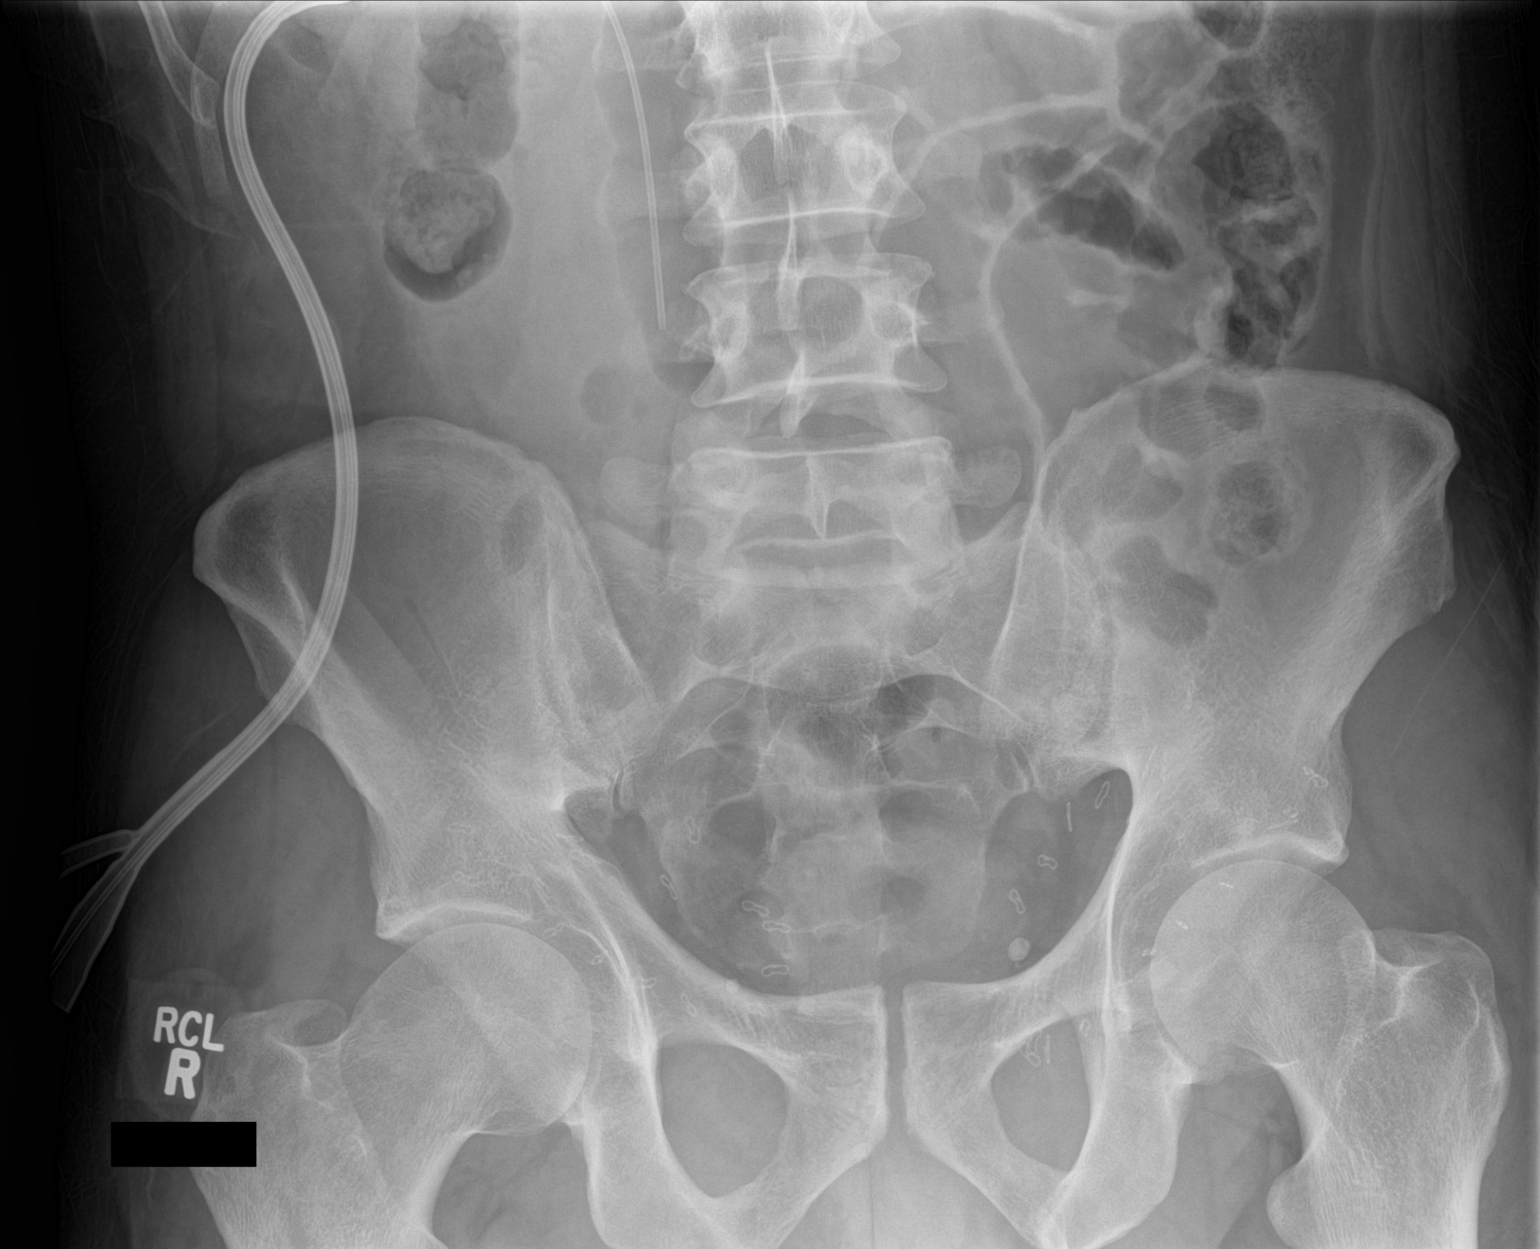

[2 of 2 positions shown; findings below may reference images not displayed]

FINDINGS: A right-sided nephrostomy catheter is noted with a ureteral tail.
Small balloon tip is seen. A few residual stones are noted in the
midportion of the right kidney. The largest of these measures
mm and likely represents a caliceal stone. A few smaller fragments
measuring 7 mm and less are noted adjacent to the catheter. Stable
left renal calculi are noted. No free air is seen. No other focal
abnormality is noted.
IMPRESSION: Few scattered residual stones following nephrolithotomy. The largest
of these measures 11 mm and appears to represent a caliceal stone

## 2019-02-16 ENCOUNTER — Emergency Department (HOSPITAL_COMMUNITY): Payer: BLUE CROSS/BLUE SHIELD

## 2019-02-16 ENCOUNTER — Other Ambulatory Visit: Payer: Self-pay

## 2019-02-16 ENCOUNTER — Encounter (HOSPITAL_COMMUNITY): Payer: Self-pay | Admitting: Emergency Medicine

## 2019-02-16 ENCOUNTER — Emergency Department (HOSPITAL_COMMUNITY)
Admission: EM | Admit: 2019-02-16 | Discharge: 2019-02-16 | Disposition: A | Discharge status: Home / Self Care | Payer: BLUE CROSS/BLUE SHIELD | Attending: Emergency Medicine | Admitting: Emergency Medicine

## 2019-02-16 DIAGNOSIS — Z85828 Personal history of other malignant neoplasm of skin: Secondary | ICD-10-CM | POA: Insufficient documentation

## 2019-02-16 DIAGNOSIS — Y9389 Activity, other specified: Secondary | ICD-10-CM | POA: Diagnosis not present

## 2019-02-16 DIAGNOSIS — S93115A Dislocation of interphalangeal joint of left lesser toe(s), initial encounter: Secondary | ICD-10-CM | POA: Diagnosis not present

## 2019-02-16 DIAGNOSIS — Y999 Unspecified external cause status: Secondary | ICD-10-CM | POA: Insufficient documentation

## 2019-02-16 DIAGNOSIS — S99922A Unspecified injury of left foot, initial encounter: Secondary | ICD-10-CM

## 2019-02-16 DIAGNOSIS — W2209XA Striking against other stationary object, initial encounter: Secondary | ICD-10-CM | POA: Diagnosis not present

## 2019-02-16 DIAGNOSIS — Y929 Unspecified place or not applicable: Secondary | ICD-10-CM | POA: Insufficient documentation

## 2019-02-16 HISTORY — DX: Malignant (primary) neoplasm, unspecified: C80.1

## 2019-02-16 MED ORDER — LIDOCAINE HCL (PF) 1 % IJ SOLN: 30.0000 mL | Freq: Once | INTRAMUSCULAR | Status: DC

## 2019-02-16 MED ORDER — LIDOCAINE HCL 1 % IJ SOLN
INTRAMUSCULAR | Status: AC
  Administered 2019-02-16: 20 mL
  Filled 2019-02-16: qty 20

## 2019-02-16 NOTE — Discharge Instructions (Signed)
You have been seen today for toe dislocation. Please read and follow all provided instructions. Return to the emergency room for worsening condition or new concerning symptoms.    1. Medications:  Continue usual home medications.  Take Tylenol or ibuprofen for pain, take as directed.  2. Treatment: rest, drink plenty of fluids.  Keep the toes buddy taped together and wear the postop shoe until you follow-up with the orthopedist in 5 to 7 days

## 2019-02-16 NOTE — ED Provider Notes (Signed)
[facility] DEPT Provider Note   CSN: [id number] Arrival date & time: 02/16/19  1527    History   Chief Complaint Chief Complaint  Patient presents with  . Foot Pain  . Toe Injury    HPI Christian Pace is a 48 y.o. male presents to the emergency department today with chief complaint of pain on left foot x1 day.  Patient states he was chasing his child yesterday and stubbed his left middle toe on the counter around approximately 8 PM last night.  Patient was wearing a shoe when this happened.  When he took his shoe off he noticed his toe was dislocated however patient went to work this morning before seeking treatment.  Patient took Tylenol and applied ice to the toe with pain relief.  He rates the pain 1 out of 10 in severity.  He describes it as throbbing and the pain is better at rest and worse with movement.  The pain does not radiate.  Patient went to urgent care prior to arrival, and told the toe was dislocated but they were are unable to give pain medicine with reductions.  Patient sent here for further evaluation. He denies previous injury to this toe, fall, head injury, loss of consciousness.       Past Medical History:  Diagnosis Date  . Cancer ([place])    skin  . Kidney stones   . Renal disorder     Patient Active Problem List   Diagnosis Date Noted  . Right kidney stone 02/29/2016    Past Surgical History:  Procedure Laterality Date  . CYSTOSCOPY W/ URETERAL STENT PLACEMENT Bilateral 01/04/2016   Procedure: CYSTOSCOPY WITH STENT REPLACEMENT;  Surgeon: Hollice Espy, MD;  Location: [facility] ORS;  Service: Urology;  Laterality: Bilateral;  . CYSTOSCOPY WITH STENT PLACEMENT Bilateral 12/02/2015   Procedure: CYSTOSCOPY WITH STENT PLACEMENT,bilateral retrograde pylorograms;  Surgeon: Hollice Espy, MD;  Location: [facility] OR[facility];  Service: Urology;  Laterality: Bilateral;  . HERNIA REPAIR    . NEPHROLITHOTOMY N/A 02/29/2016   Procedure:  NEPHROLITHOTOMY PERCUTANEOUS cystoscopy right stent removal;  Surgeon: Hollice Espy, MD;  Location: [facility] ORS;  Service: Urology;  Laterality: N/A;  . URETEROSCOPY WITH HOLMIUM LASER LITHOTRIPSY Left 12/02/2015   Procedure: URETEROSCOPY WITH HOLMIUM LASER LITHOTRIPSY WITH RETROGRADE PYLOGRAM;  Surgeon: Hollice Espy, MD;  Location: [facility] OR[facility];  Service: Urology;  Laterality: Left;  . URETEROSCOPY WITH HOLMIUM LASER LITHOTRIPSY Left 01/04/2016   Procedure: URETEROSCOPY WITH HOLMIUM LASER LITHOTRIPSY;  Surgeon: Hollice Espy, MD;  Location: [facility] OR[facility];  Service: Urology;  Laterality: Left;        Home Medications    Prior to Admission medications   Medication Sig Start Date End Date Taking? Authorizing Provider  docusate sodium (COLACE) 100 MG capsule Take 1 capsule (100 mg total) by mouth 2 (two) times daily. Patient not taking: Reported on 04/13/2016 03/01/16   Hollice Espy, MD  HYDROcodone-acetaminophen (NORCO/VICODIN) 5-325 MG tablet Take 1-2 tablets by mouth every 6 (six) hours as needed for moderate pain. Patient not taking: Reported on 04/13/2016 03/01/16   Hollice Espy, MD    Family History Family History  Problem Relation Age of Onset  . Healthy Mother   . Healthy Father     Social History Social History   Tobacco Use  . Smoking status: Never Smoker  Substance Use Topics  . Alcohol use: Yes    Comment: occassional  . Drug use: No     Allergies   Fentanyl  Review of Systems Review of Systems  Skin: Positive for wound.  All other systems reviewed and are negative.    Physical Exam Updated Vital Signs BP (!) 135/91 (BP Location: Left Arm)   Pulse (!) 107   Temp 98.2 F (36.8 C) (Oral)   Resp 18   Wt 90.7 kg   SpO2 98%   BMI 27.12 kg/m   Physical Exam Vitals signs and nursing note reviewed.  Constitutional:      Appearance: He is well-developed. He is not toxic-appearing.  HENT:     Head: Normocephalic and atraumatic.  Eyes:     General: No  scleral icterus.       Right eye: No discharge.        Left eye: No discharge.     Conjunctiva/sclera: Conjunctivae normal.  Neck:     Musculoskeletal: Normal range of motion.  Cardiovascular:     Rate and Rhythm: Regular rhythm. Tachycardia present.     Pulses: Normal pulses.          Radial pulses are 2+ on the right side and 2+ on the left side.       Dorsalis pedis pulses are 2+ on the right side and 2+ on the left side.     Heart sounds: Normal heart sounds.  Pulmonary:     Effort: Pulmonary effort is normal.     Breath sounds: Normal breath sounds.  Abdominal:     General: There is no distension.  Musculoskeletal: Normal range of motion.     Right lower leg: No edema.     Left lower leg: No edema.     Comments: Obvious dislocation to third toe on left foot.  Full range of motion of left ankle and left knee.  Skin:    General: Skin is warm and dry.     Capillary Refill: Capillary refill takes less than 2 seconds.     Comments: No open wounds, laceration to third toe left foot.  There is ecchymosis on lateral side.  Neurological:     Mental Status: He is oriented to person, place, and time.     Comments: Fluent speech, no facial droop.  Psychiatric:        Mood and Affect: Mood is anxious.      ED Treatments / Results  Labs (all labs ordered are listed, but only abnormal results are displayed) Labs Reviewed - No data to display  EKG None   Radiology Dg Foot Complete Left  Result Date: 02/16/2019 CLINICAL DATA:  S/p reduction of 3rd toe of left foot EXAM: LEFT FOOT - COMPLETE 3+ VIEW COMPARISON:  02/16/2019 FINDINGS: There is been interval reduction of the third proximal interphalangeal joint. No acute fracture. No radiopaque foreign body or soft tissue gas. IMPRESSION: Interval reduction of the third proximal interphalangeal joint. Electronically Signed   By: Nolon Nations M.D.   On: 02/16/2019 18:09   Dg Foot Complete Left  Result Date: 02/16/2019 CLINICAL  DATA:  Dislocated third toe EXAM: LEFT FOOT - COMPLETE 3+ VIEW COMPARISON:  11/18/2013 FINDINGS: There is dislocation of the third distal phalanx dorsally. No definitive fracture is seen. No other focal abnormality is noted. IMPRESSION: Dislocation of the third distal phalanx. No other focal abnormality is seen. Electronically Signed   By: Inez Catalina M.D.   On: 02/16/2019 16:24    Procedures Procedures (including critical care time)  Medications Ordered in ED Medications  lidocaine (XYLOCAINE) 1 % (with pres) injection (20 mLs  Given  02/16/19 1706)     Initial Impression / Assessment and Plan / ED Course  I have reviewed the triage vital signs and the nursing notes.  Pertinent labs & imaging results that were available during my care of the patient were reviewed by me and considered in my medical decision making (see chart for details).    Patient presents with obvious dislocation to third toe left foot.  Went to urgent care prior to arrival but reports they were unable to give pain medicine for the reduction so he came here for further evaluation.    X-ray of left foot viewed by me shows dislocation of third distal phalanx.  DP pulses 2+ in left foot.  Prior to reduction, digital block performed with lidocaine 1%.  My attending Dr. Jeanell Sparrow performed reduction of toe.  Patient tolerated procedure without complication.  Post reduction films obtained, toe is no longer dislocated.  No other fracture seen.  Patient's toes were buddy taped and postop shoe applied.  Instructed to follow-up with orthopedics.   Patient is able to ambulate in the ED.  I explained the diagnosis to the patient. Patient is comfortable with above plan and is stable for discharge at this time. All questions were answered prior to disposition. Strict return precautions for returning to the ED were discussed. Encouraged follow up with PCP to have blood pressure rechecked because it was elevated at today's visit. He admits to  feeling anxious prior to arrival and during the procedure, likely cause of tachycardia.   This note was prepared with assistance of Systems analyst. Occasional wrong-word or sound-a-like substitutions may have occurred due to the inherent limitations of voice recognition software.        Final Clinical Impressions(s) / ED Diagnoses   Final diagnoses:  Injury of toe on left foot, initial encounter    ED Discharge Orders    None       Flint Melter 02/16/19 2331    Pattricia Boss, MD 02/18/19 1434

## 2019-02-16 NOTE — ED Triage Notes (Signed)
Dislocated middle toe on l/foot- Seen and xrayed at urgent care today.Pt stated that he banged the middle toe of his l/foot on a cabinet last night. Tx will OTC meds and ice.  Denies pain when not moving it. Referred to ED by Urgent Care

## 2019-11-25 IMAGING — CR DG FOOT COMPLETE 3+V*L*
3 series · 3 of 3 positions shown · non-contrast
Comparison: 11/18/2013

CLINICAL DATA: Dislocated third toe

EXAM:
LEFT FOOT - COMPLETE 3+ VIEW

[x foot ap left]
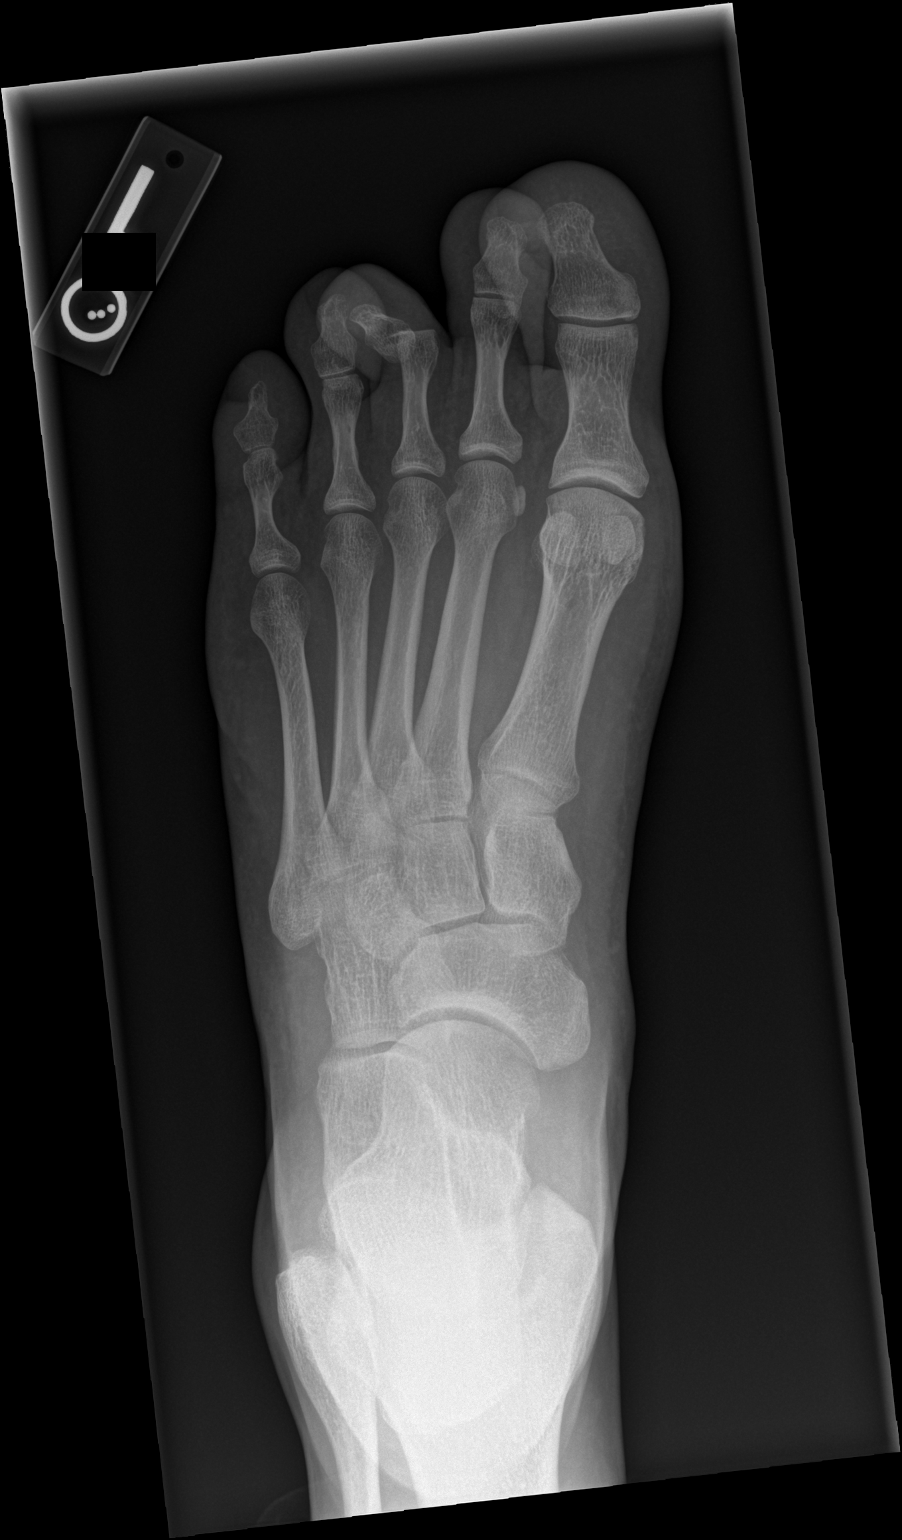

[x foot obl left]
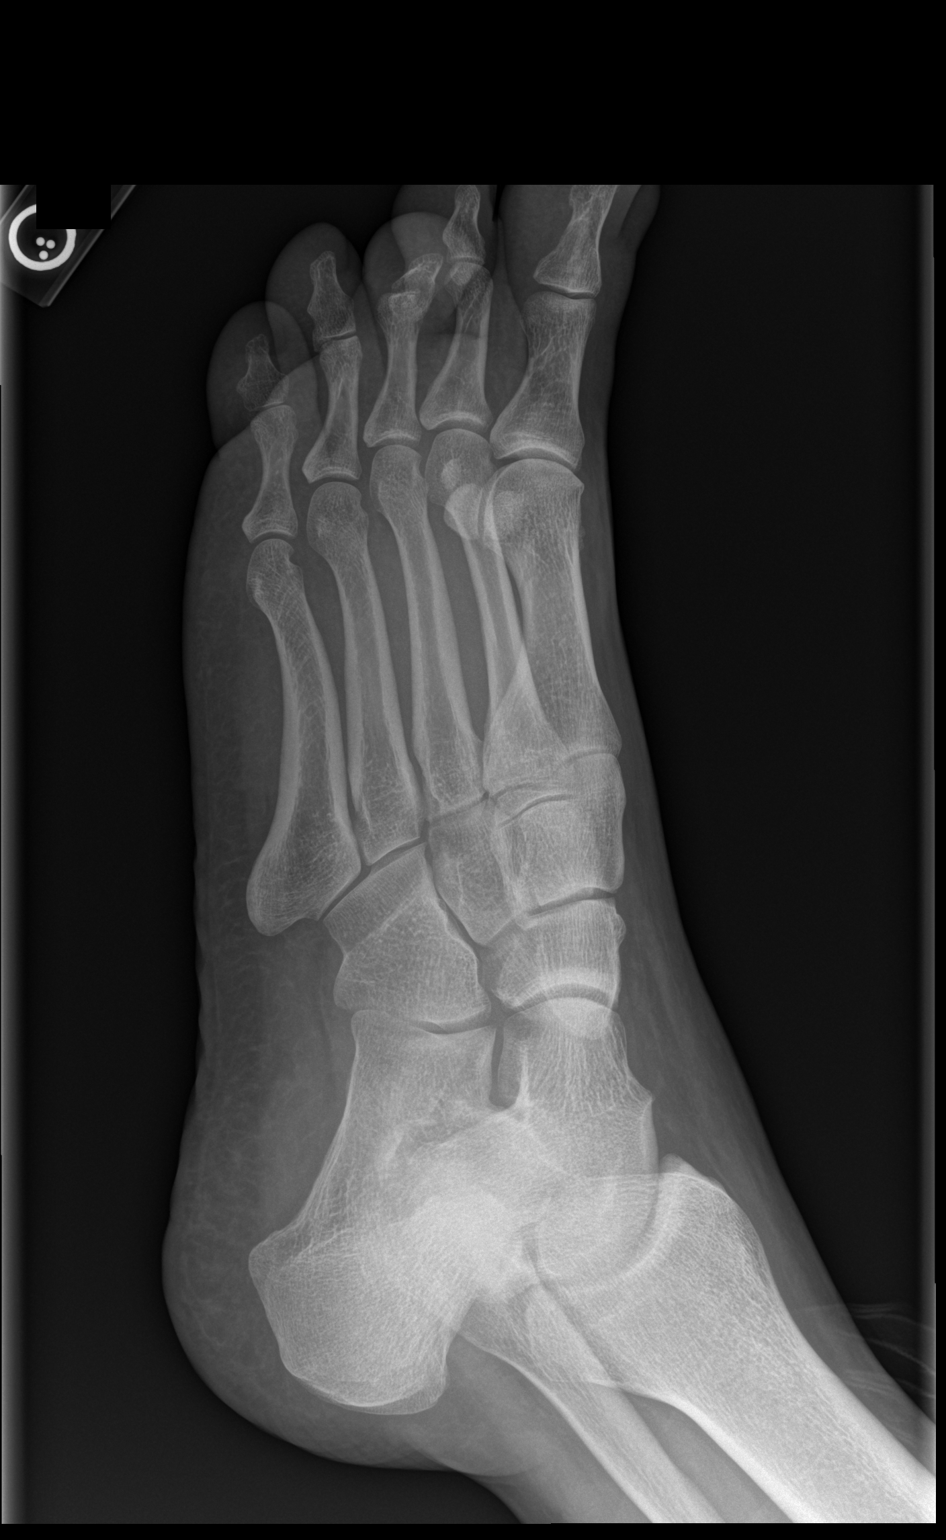

[x foot lat left]
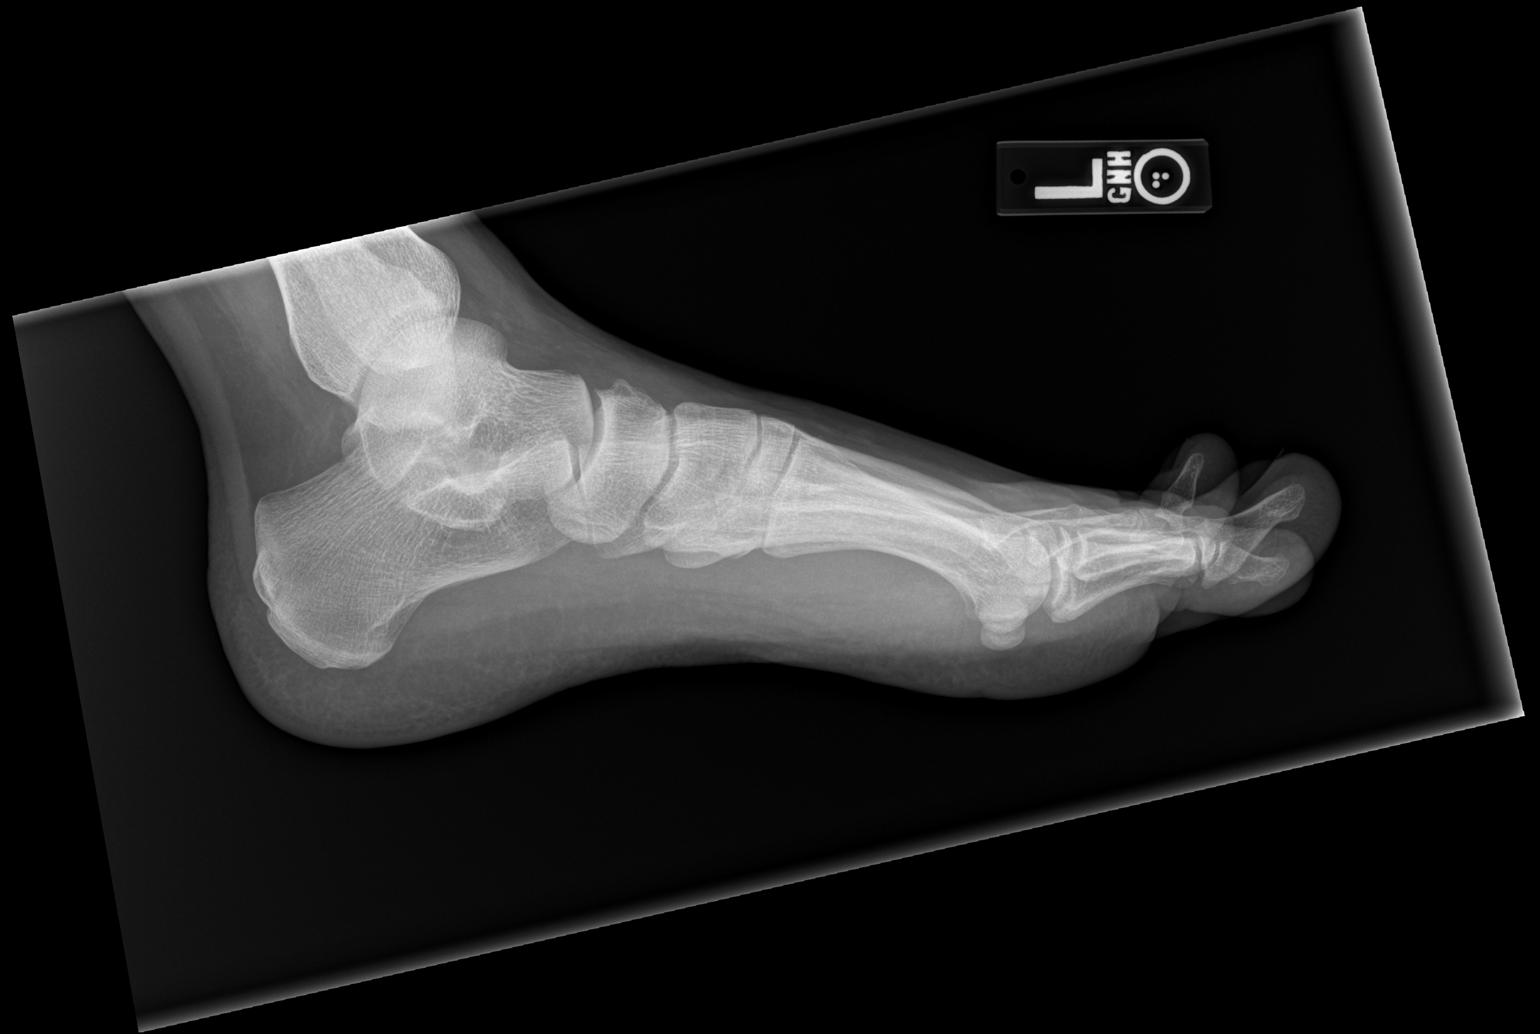

[3 of 3 positions shown; findings below may reference images not displayed]

FINDINGS: There is dislocation of the third distal phalanx dorsally. No
definitive fracture is seen. No other focal abnormality is noted.
IMPRESSION: Dislocation of the third distal phalanx. No other focal abnormality
is seen.

## 2019-11-25 IMAGING — CR DG FOOT COMPLETE 3+V*L*
3 series · 3 of 3 positions shown · non-contrast
Comparison: 02/16/2019

CLINICAL DATA: S/p reduction of 3rd toe of left foot

EXAM:
LEFT FOOT - COMPLETE 3+ VIEW

[x foot ap left]
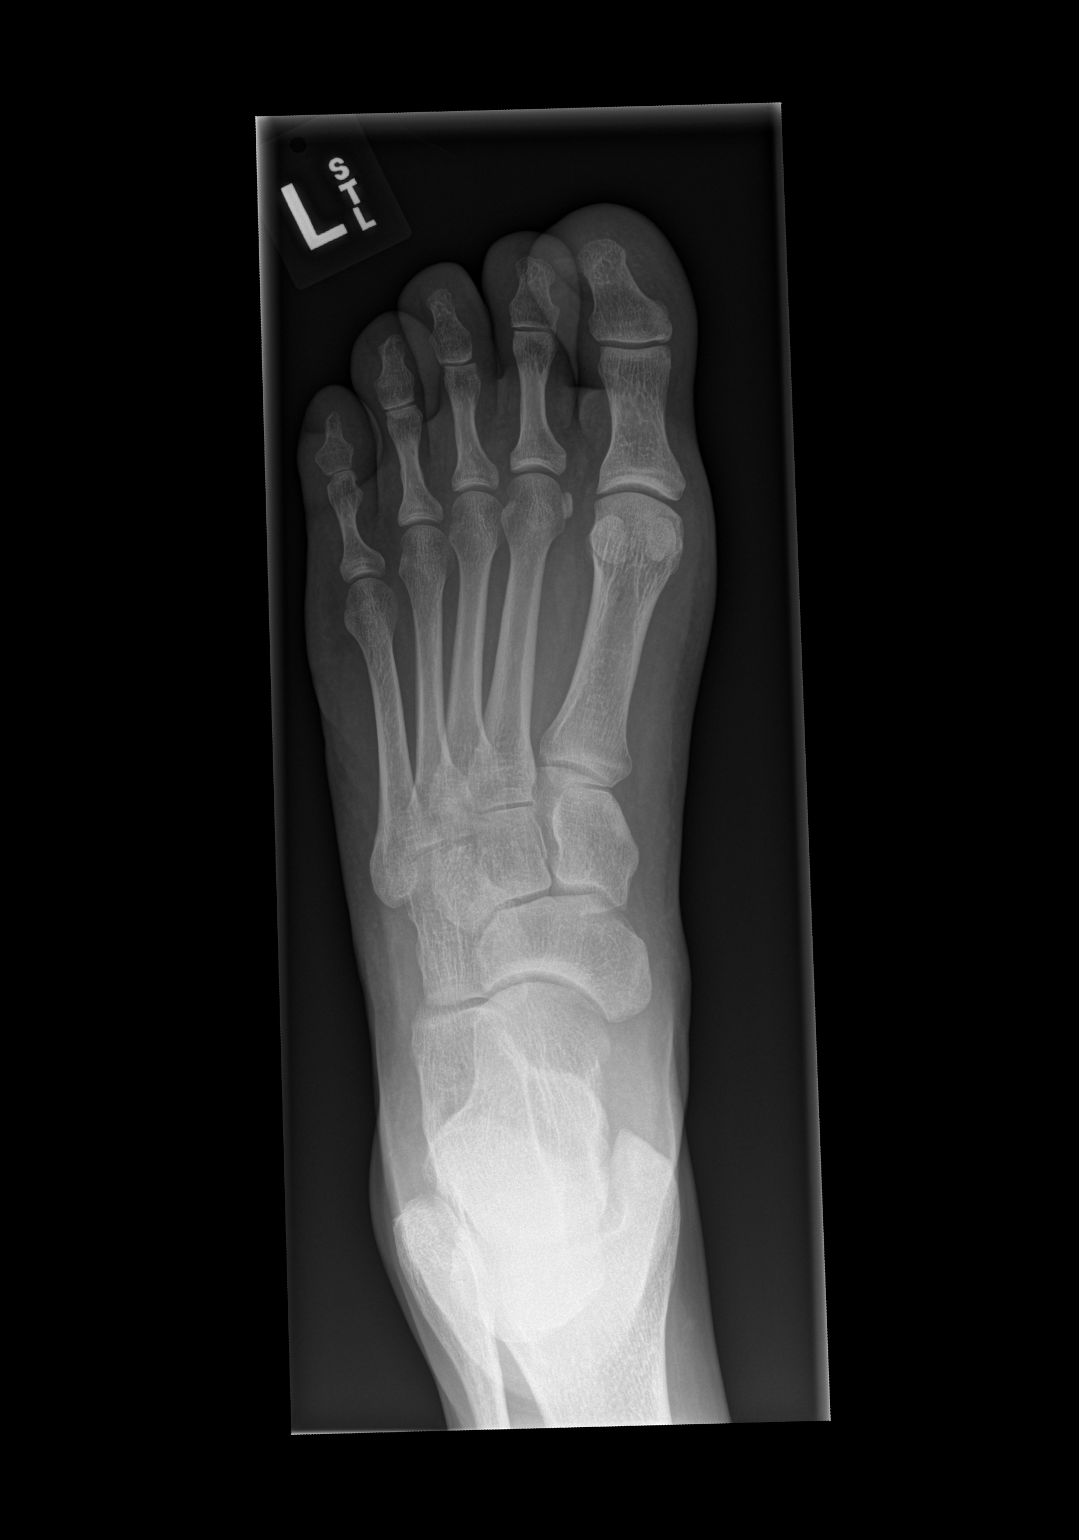

[x foot obl left]
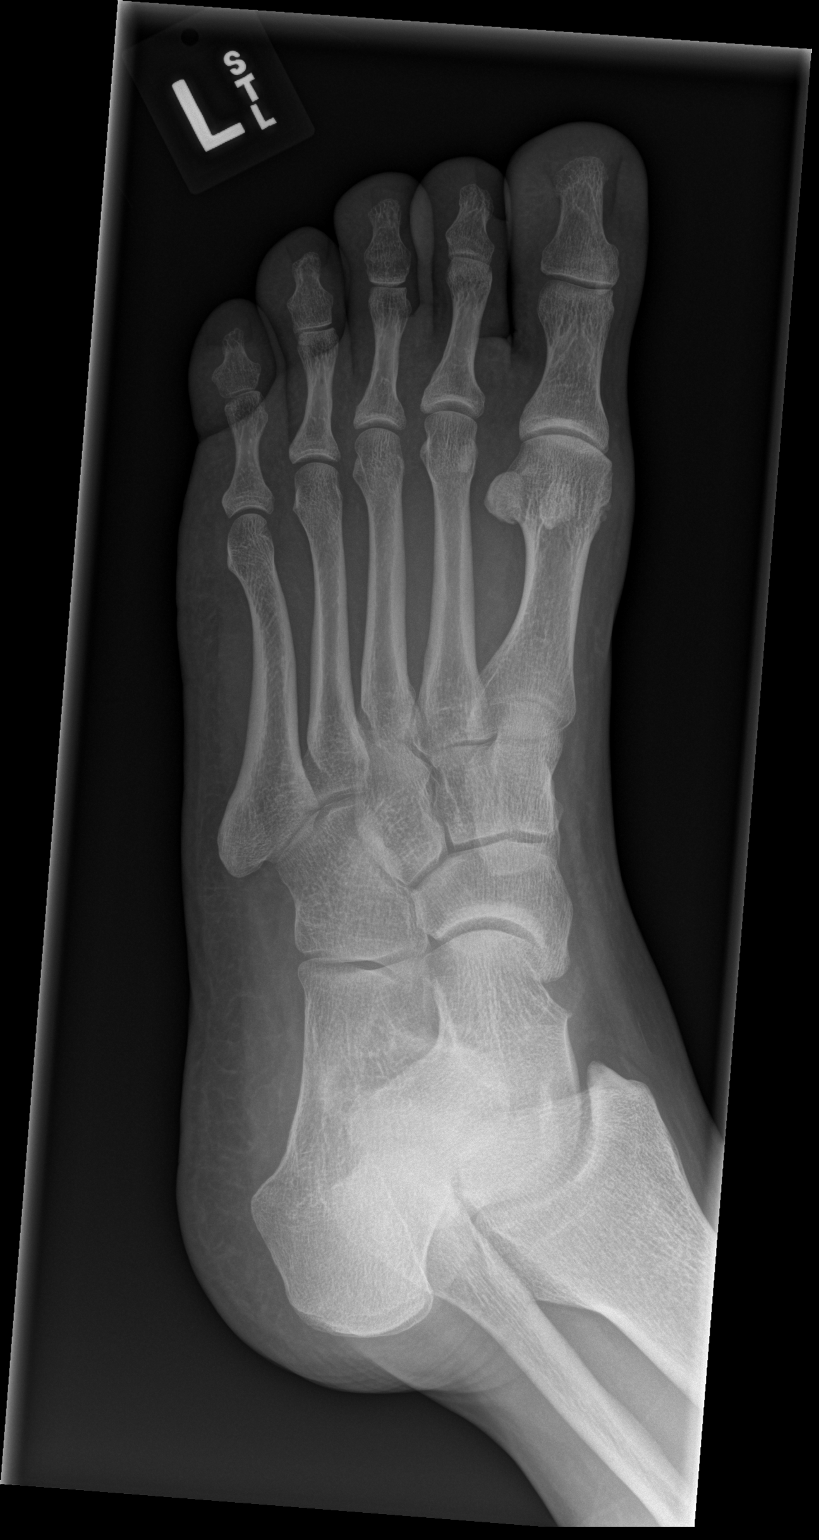

[x foot lat left]
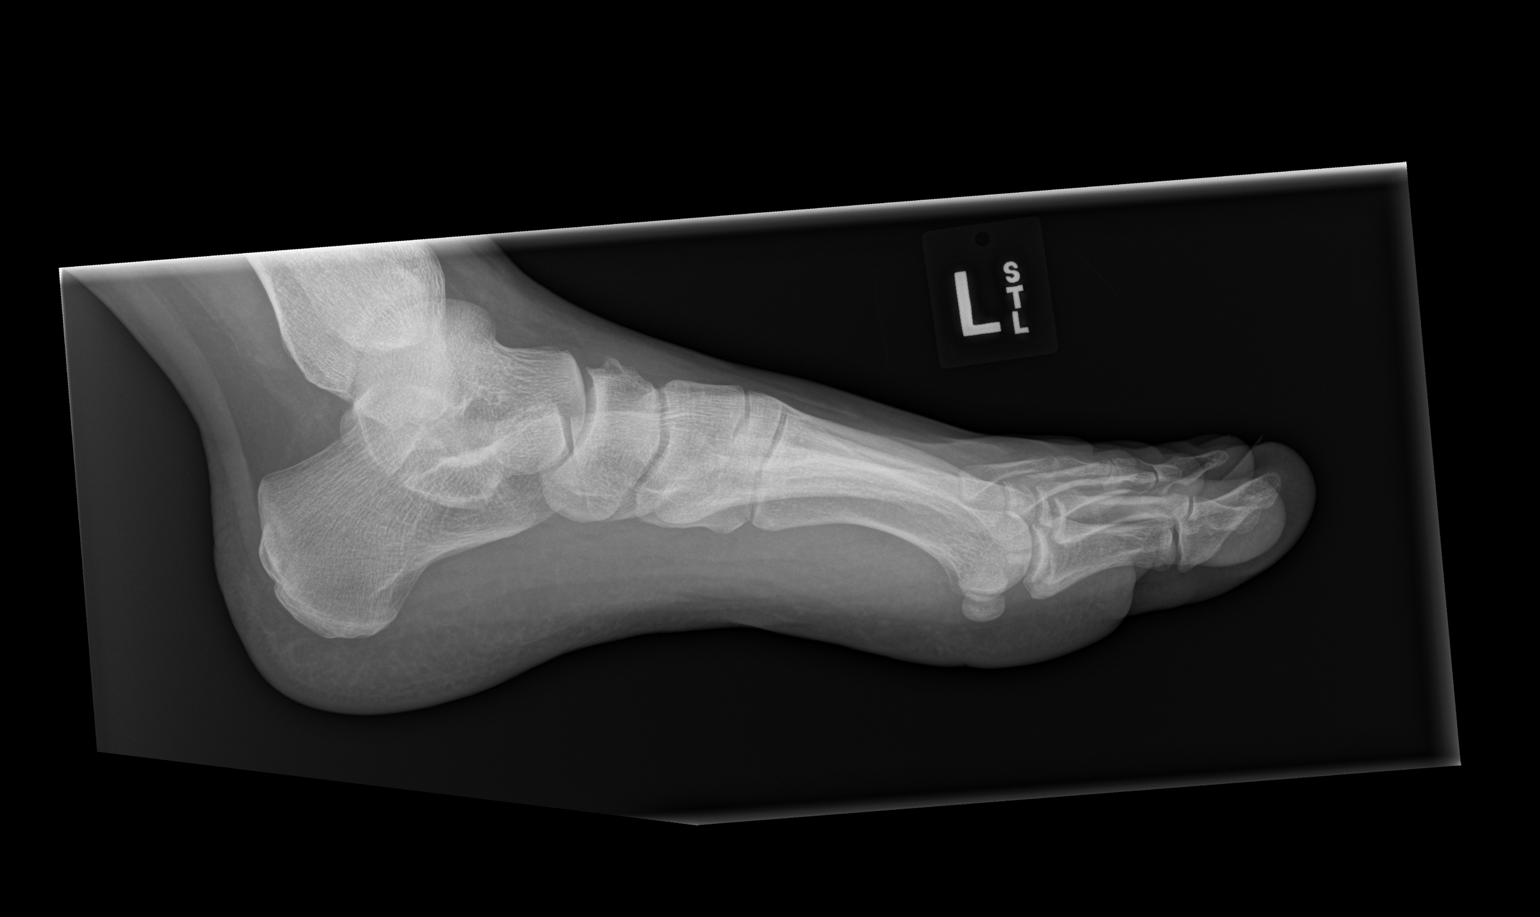

[3 of 3 positions shown; findings below may reference images not displayed]

FINDINGS: There is been interval reduction of the third proximal
interphalangeal joint. No acute fracture. No radiopaque foreign body
or soft tissue gas.
IMPRESSION: Interval reduction of the third proximal interphalangeal joint.

## 2021-05-11 ENCOUNTER — Emergency Department (HOSPITAL_COMMUNITY): Payer: BC Managed Care – PPO

## 2021-05-11 ENCOUNTER — Encounter (HOSPITAL_COMMUNITY): Payer: Self-pay

## 2021-05-11 ENCOUNTER — Other Ambulatory Visit: Payer: Self-pay

## 2021-05-11 ENCOUNTER — Emergency Department (HOSPITAL_COMMUNITY)
Admission: EM | Admit: 2021-05-11 | Discharge: 2021-05-11 | Disposition: A | Discharge status: Home / Self Care | Payer: BC Managed Care – PPO | Attending: Emergency Medicine | Admitting: Emergency Medicine

## 2021-05-11 DIAGNOSIS — H53149 Visual discomfort, unspecified: Secondary | ICD-10-CM | POA: Insufficient documentation

## 2021-05-11 DIAGNOSIS — R519 Headache, unspecified: Secondary | ICD-10-CM

## 2021-05-11 DIAGNOSIS — Z85828 Personal history of other malignant neoplasm of skin: Secondary | ICD-10-CM | POA: Insufficient documentation

## 2021-05-11 LAB — BASIC METABOLIC PANEL
Anion gap: 9 (ref 5–15)
BUN: 17 mg/dL (ref 6–20)
CO2: 26 mmol/L (ref 22–32)
Calcium: 9.2 mg/dL (ref 8.9–10.3)
Chloride: 103 mmol/L (ref 98–111)
Creatinine, Ser: 1.29 mg/dL — ABNORMAL HIGH (ref 0.61–1.24)
GFR, Estimated: >60 mL/min (ref >60)
Glucose, Bld: 104 mg/dL — ABNORMAL HIGH (ref 70–99)
Potassium: 3.9 mmol/L (ref 3.5–5.1)
Sodium: 138 mmol/L (ref 135–145)

## 2021-05-11 LAB — MAGNESIUM: Magnesium: 2 mg/dL (ref 1.7–2.4)

## 2021-05-11 MED ORDER — SODIUM CHLORIDE 0.9 % IV BOLUS
500.0000 mL | Freq: Once | INTRAVENOUS | Status: AC
  Administered 2021-05-11: 500 mL via INTRAVENOUS

## 2021-05-11 MED ORDER — METOCLOPRAMIDE HCL 5 MG/ML IJ SOLN
10.0000 mg | Freq: Once | INTRAMUSCULAR | Status: AC
  Administered 2021-05-11: 10 mg via INTRAVENOUS
  Filled 2021-05-11: qty 2

## 2021-05-11 MED ORDER — ACETAMINOPHEN 325 MG PO TABS
650.0000 mg | ORAL_TABLET | Freq: Once | ORAL | Status: AC
  Administered 2021-05-11: 650 mg via ORAL
  Filled 2021-05-11: qty 2

## 2021-05-11 MED ORDER — DIPHENHYDRAMINE HCL 50 MG/ML IJ SOLN
25.0000 mg | Freq: Once | INTRAMUSCULAR | Status: AC
  Administered 2021-05-11: 25 mg via INTRAVENOUS
  Filled 2021-05-11: qty 1

## 2021-05-11 NOTE — ED Notes (Signed)
An After Visit Summary was printed and given to the patient. Discharge instructions given and no further questions at this time.  

## 2021-05-11 NOTE — Discharge Instructions (Addendum)
At this time there does not appear to be the presence of an emergent medical condition, however there is always the potential for conditions to change. Please read and follow the below instructions.  Please return to the Emergency Department immediately for any new or worsening symptoms. Please be sure to follow up with your Primary Care Provider within one week regarding your visit today; please call their office to schedule an appointment even if you are feeling better for a follow-up visit. Please follow-up with the specialist at either [facility] or [facility] for further evaluation of your CT abnormality.  Please drink plenty water and get plenty of rest.  Go to the nearest Emergency Department immediately if: You have fever or chills Your headache gets very bad quickly. Your headache gets worse after a lot of physical activity. You keep throwing up. You have a stiff neck. You have trouble seeing. You have trouble speaking. You have pain in the eye or ear. Your muscles are weak or you lose muscle control. You lose your balance or have trouble walking. You feel like you will pass out (faint) or you pass out. You are mixed up (confused). You have a seizure. You have any new/concerning or worsening of symptoms   Please read the additional information packets attached to your discharge summary.  Do not take your medicine if  develop an itchy rash, swelling in your mouth or lips, or difficulty breathing; call 911 and seek immediate emergency medical attention if this occurs.  You may review your lab tests and imaging results in their entirety on your MyChart account.  Please discuss all results of fully with your primary care provider and other specialist at your follow-up visit.  Note: Portions of this text may have been transcribed using voice recognition software. Every effort was made to ensure accuracy; however, inadvertent computerized transcription errors may still be  present.

## 2021-05-11 NOTE — ED Provider Notes (Signed)
[facility] DEPT Provider Note   CSN: [id number] Arrival date & time: 05/11/21  1052     History Chief Complaint  Patient presents with  . Headache    Christian Pace is a 50 y.o. 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  Patient presents today for headache onset 10:30 AM this morning while at a meeting with his boss.  Patient reports he is not exerting himself at the time, sudden onset moderate-severe intensity left occipital pain, sharp nonradiating no clear inciting event.  Patient reports pain is somewhat worsened by bright light.  No medication prior to arrival to help with symptoms.  Patient reports he is feels a shaking sensation all of his extremities with his pain.  Patient denies alcohol use or recent alcohol sensation.  Denies fever/chills, vision changes, nausea/vomiting, neck pain/stiffness, abdominal pain, nausea/vomiting, back pain, numbness/weakness, tingling or any additional concerns   HPI     Past Medical History:  Diagnosis Date  . Cancer ([place])    skin  . Kidney stones   . Renal disorder     Patient Active Problem List   Diagnosis Date Noted  . Right kidney stone 02/29/2016    Past Surgical History:  Procedure Laterality Date  . CYSTOSCOPY W/ URETERAL STENT PLACEMENT Bilateral 01/04/2016   Procedure: CYSTOSCOPY WITH STENT REPLACEMENT;  Surgeon: Hollice Espy, MD;  Location: [facility] ORS;  Service: Urology;  Laterality: Bilateral;  . CYSTOSCOPY WITH STENT PLACEMENT Bilateral 12/02/2015   Procedure: CYSTOSCOPY WITH STENT PLACEMENT,bilateral retrograde pylorograms;  Surgeon: Hollice Espy, MD;  Location: [facility] ORS;  Service: Urology;  Laterality: Bilateral;  . HERNIA REPAIR    . NEPHROLITHOTOMY N/A 02/29/2016   Procedure: NEPHROLITHOTOMY PERCUTANEOUS cystoscopy right stent removal;  Surgeon: Hollice Espy, MD;  Location: [facility] ORS;  Service: Urology;  Laterality: N/A;  .  URETEROSCOPY WITH HOLMIUM LASER LITHOTRIPSY Left 12/02/2015   Procedure: URETEROSCOPY WITH HOLMIUM LASER LITHOTRIPSY WITH RETROGRADE PYLOGRAM;  Surgeon: Hollice Espy, MD;  Location: [facility] OR[facility];  Service: Urology;  Laterality: Left;  . URETEROSCOPY WITH HOLMIUM LASER LITHOTRIPSY Left 01/04/2016   Procedure: URETEROSCOPY WITH HOLMIUM LASER LITHOTRIPSY;  Surgeon: Hollice Espy, MD;  Location: [facility] OR[facility];  Service: Urology;  Laterality: Left;       Family History  Problem Relation Age of Onset  . Healthy Mother   . Healthy Father     Social History   Tobacco Use  . Smoking status: Never Smoker  . Smokeless tobacco: Never Used  Vaping Use  . Vaping Use: Never used  Substance Use Topics  . Alcohol use: Yes    Comment: occassional  . Drug use: No    Home Medications Prior to Admission medications   Medication Sig Start Date End Date Taking? Authorizing Provider  docusate sodium (COLACE) 100 MG capsule Take 1 capsule (100 mg total) by mouth 2 (two) times daily. Patient not taking: Reported on 04/13/2016 03/01/16   Hollice Espy, MD  HYDROcodone-acetaminophen (NORCO/VICODIN) 5-325 MG tablet Take 1-2 tablets by mouth every 6 (six) hours as needed for moderate pain. Patient not taking: Reported on 04/13/2016 03/01/16   Hollice Espy, MD    Allergies    Fentanyl  Review of Systems   Review of Systems Ten systems are reviewed and are negative for acute change except as noted in the HPI  Physical Exam Updated Vital Signs BP (!) 130/102   Pulse (!) 109   Temp 98.8 F (37.1  C) (Oral)   Resp 18   Ht 6' (1.829 m)   Wt 94.8 kg   SpO2 97%   BMI 28.35 kg/m   Physical Exam Constitutional:      General: He is not in acute distress.    Appearance: Normal appearance. He is well-developed. He is not ill-appearing or diaphoretic.  HENT:     Head: Normocephalic and atraumatic.  Eyes:     General: Vision grossly intact. Gaze aligned appropriately.     Extraocular Movements:  Extraocular movements intact.     Conjunctiva/sclera: Conjunctivae normal.     Pupils: Pupils are equal, round, and reactive to light.     Comments: Visual fields grossly intact bilaterally. No pain with EOM.  Neck:     Trachea: Trachea and phonation normal.     Meningeal: Brudzinski's sign absent.  Pulmonary:     Effort: Pulmonary effort is normal. No respiratory distress.  Abdominal:     General: There is no distension.     Palpations: Abdomen is soft.     Tenderness: There is no abdominal tenderness. There is no guarding or rebound.  Musculoskeletal:        General: Normal range of motion.     Cervical back: Normal range of motion and neck supple. No pain with movement.  Skin:    General: Skin is warm and dry.  Neurological:     Mental Status: He is alert.     GCS: GCS eye subscore is 4. GCS verbal subscore is 5. GCS motor subscore is 6.     Comments: Speech is clear and goal oriented, follows commands Major Cranial nerves without deficit, no facial droop Normal strength in upper and lower extremities bilaterally including dorsiflexion and plantar flexion, strong and equal grip strength Sensation normal to light and sharp touch Moves extremities without ataxia, coordination intact Normal finger to nose and rapid alternating movements Neg romberg, no pronator drift Normal gait Normal heel-shin and balance No asterixis  Psychiatric:        Behavior: Behavior normal.     ED Results / Procedures / Treatments   Labs (all labs ordered are listed, but only abnormal results are displayed) Labs Reviewed  BASIC METABOLIC PANEL - Abnormal; Notable for the following components:      Result Value   Glucose, Bld 104 (*)    Creatinine, Ser 1.29 (*)    All other components within normal limits  MAGNESIUM    EKG None  Radiology CT Head Wo Contrast  Result Date: 05/11/2021 CLINICAL DATA:  Headache, new or worsening. Sudden onset headache 2.5 hours ago. EXAM: CT HEAD WITHOUT  CONTRAST TECHNIQUE: Contiguous axial images were obtained from the base of the skull through the vertex without intravenous contrast. COMPARISON:  No pertinent prior exams available for comparison. FINDINGS: Brain: Cerebral volume is normal. There is no acute intracranial hemorrhage. No demarcated cortical infarct. No extra-axial fluid collection. No evidence of intracranial mass. No midline shift. Midline retrocerebellar CSF density prominence measuring 3.5 x 2.5 cm in transaxial dimensions and 6.4 cm in craniocaudal dimension. 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 Vascular: No hyperdense vessel. Skull: Normal. Negative for fracture or focal lesion. Sinuses/Orbits: Visualized orbits show no acute finding. Mild-to-moderate mucosal thickening within the imaged left maxillary sinus. IMPRESSION: No evidence of acute intracranial abnormality. Retrocerebellar CSF density prominence, as described. 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 Mild/moderate mucosal thickening within the imaged left maxillary  sinus. Electronically Signed   By: Kellie Simmering DO   On: 05/11/2021 13:33    Procedures Procedures   Medications Ordered in ED Medications  sodium chloride 0.9 % bolus 500 mL (0 mLs Intravenous Stopped 05/11/21 1344)  metoCLOPramide (REGLAN) injection 10 mg (10 mg Intravenous Given 05/11/21 1304)  diphenhydrAMINE (BENADRYL) injection 25 mg (25 mg Intravenous Given 05/11/21 1303)  acetaminophen (TYLENOL) tablet 650 mg (650 mg Oral Given 05/11/21 1458)    ED Course  I have reviewed the triage vital signs and the nursing notes.  Pertinent labs & imaging results that were available during my care of the patient were reviewed by me and considered in my medical decision making (see chart for details).  Clinical Course as of 05/11/21 1512  Tue May 11, 2021  1357 Dr. Quinn Axe neurology [BM]  1404 Arachnoid cyst [BM]    Clinical Course User  Index [BM] Gari Crown   MDM Rules/Calculators/A&P                         Additional history obtained from: 1. Nursing notes from this visit. 2. Family, patient's mother at bedside. ---------------- 27 year old otherwise healthy male presented for evaluation of sudden onset headache while at work today, associated with photophobia and as shaking of his extremities.  On exam he is anxious appearing but no acute distress.  Neuro examination is within normal limits.  No cranial nerve deficit.  Airway clear.  No meningeal signs.  Abdomen soft nontender.  Neurovascular tact for all 4 extremities.  No asterixis.  No recent alcohol use ordered alcohol cessation.   Given onset of the headache was less than 6 hours ago will obtain CT head to evaluate for SAH.  Plan of care discussed with attending physician Dr. Kathrynn Humble who agrees. - CT Head:  IMPRESSION:  No evidence of acute intracranial abnormality.    Retrocerebellar CSF density prominence, as described. This may  reflect a mega cisterna magna or posterior fossa arachnoid cyst.    Mild/moderate mucosal thickening within the imaged left maxillary  sinus.  - 4:04 PM: Consult with neurologist Dr. Quinn Axe who has reviewed patient's case and CT imaging today.  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  Also advises given CT scan was obtained within 6 hours which can r/o SAH.  Advises treatment of patient's headache likely migraine. - Patient was reevaluated he is resting comfortably no acute distress states understanding of findings above and neurology recommendations and he is agreeable.  Patient reports his headache has greatly improved since receiving migraine cocktail including Reglan and Benadryl along with IV fluids - Patient was inquiring about the tremor sensation that he was having to his bilateral extremities when his headache is most severe.  Suspect this to be  secondary to his headache anxiety and pain as it has completely resolved upon my evaluation.  Again he denies any history of alcohol abuse or recent binging or alcohol cessation does not appear to be in alcohol withdrawal.  BMP and magnesium levels were added to check for electrolyte derangements.  BMP shows creatinine of 1.29 which is similar to prior.  No emergent electrolyte derangements.  Magnesium level was normal.  Patient has been tolerating p.o. and ambulating around the emergency department without assistance or difficulty.  Suspect patient's headache may be secondary to migraine at this time.  Low suspicion for [facility], meningitis or other emergent pathologies at this time.  At this time there does not appear to be any evidence of an acute emergency medical condition and the patient appears stable for discharge with appropriate outpatient follow up. Diagnosis was discussed with patient who verbalizes understanding of care plan and is agreeable to discharge. I have discussed return precautions with patient and his mother who verbalizes understanding. Patient encouraged to follow-up with their PCP and neurology. All questions answered.  Patient's case discussed with Dr. Kathrynn Humble who agrees with plan to discharge with follow-up.   Note: Portions of this report may have been transcribed using voice recognition software. Every effort was made to ensure accuracy; however, inadvertent computerized transcription errors may still be present. Final Clinical Impression(s) / ED Diagnoses Final diagnoses:  Acute nonintractable headache, unspecified headache type    Rx / DC Orders ED Discharge Orders         Ordered    Ambulatory referral to Neurology       Comments: An appointment is requested in approximately: within 1 week   05/11/21 1414    Ambulatory referral to Neurology       Comments: An appointment is requested in approximately: within 1 week   05/11/21 1414           Gari Crown 05/11/21 1513    Varney Biles, MD 05/12/21 1512

## 2021-05-11 NOTE — ED Triage Notes (Addendum)
Per EMS- patient reports that he was at work and was having a meeting with his boss when he had a sudden onset of a headache. Patient rates pain 7/10. Patient denies a history of  Migraines.   Patient added that he had shaking in all extremities when the headache occurred.

## 2021-05-12 ENCOUNTER — Encounter: Payer: Self-pay | Admitting: Neurology

## 2021-05-15 ENCOUNTER — Encounter: Payer: Self-pay | Admitting: Neurology

## 2021-05-20 ENCOUNTER — Other Ambulatory Visit: Payer: Self-pay

## 2021-05-20 ENCOUNTER — Ambulatory Visit (INDEPENDENT_AMBULATORY_CARE_PROVIDER_SITE_OTHER): Payer: BC Managed Care – PPO | Admitting: Neurology

## 2021-05-20 ENCOUNTER — Encounter: Payer: Self-pay | Admitting: Neurology

## 2021-05-20 VITALS — BP 147/93 | HR 106 | Ht 72.0 in | Wt 213.0 lb

## 2021-05-20 DIAGNOSIS — R351 Nocturia: Secondary | ICD-10-CM | POA: Diagnosis not present

## 2021-05-20 DIAGNOSIS — G930 Cerebral cysts: Secondary | ICD-10-CM

## 2021-05-20 DIAGNOSIS — R0683 Snoring: Secondary | ICD-10-CM

## 2021-05-20 DIAGNOSIS — R030 Elevated blood-pressure reading, without diagnosis of hypertension: Secondary | ICD-10-CM

## 2021-05-20 DIAGNOSIS — G4719 Other hypersomnia: Secondary | ICD-10-CM | POA: Diagnosis not present

## 2021-05-20 DIAGNOSIS — E663 Overweight: Secondary | ICD-10-CM

## 2021-05-20 DIAGNOSIS — R519 Headache, unspecified: Secondary | ICD-10-CM

## 2021-05-20 DIAGNOSIS — Z9189 Other specified personal risk factors, not elsewhere classified: Secondary | ICD-10-CM

## 2021-05-20 MED ORDER — AMITRIPTYLINE HCL 25 MG PO TABS: 50.0000 mg | ORAL_TABLET | Freq: Every day | ORAL | 3 refills | Status: DC

## 2021-05-20 NOTE — Progress Notes (Signed)
Subjective:    Patient ID: Christian Pace is a 50 y.o. male.  HPI     Star Age, MD, PhD [facility] [address] [place], [place] [address]  I saw patient, Christian Pace, as a referral from the emergency room for evaluation of his headaches.  The patient is unaccompanied today.  Christian Pace is a 50 year old right-handed gentleman with an underlying medical history of kidney stones, basal cell cancer, and overweight state, who presented to the emergency room on 05/11/2021 with new onset headaches.   He reports that the headache started fairly abruptly, he was at work at the time.  It was behind the left ear and in the occipital area, was sharp and stabbing, fairly constant at times, not associated with nausea or vomiting or photophobia or sonophobia.  He did have some trembling in his hands while at work as noticed by his boss.  EMS was called.   He does not have a history of headaches except for sinus congestion and frontal pressure-like headaches at times.  He did not have migraines growing up.  He denies any previous evidence of an arachnoid cyst, reports that he had a brain MRI some 18 years ago and it was not mentioned at the time.  He reports that his sister has MS and he had worried about MS symptoms at the time.  He denies any sudden onset of one-sided weakness or numbness or tingling or droopy face or slurring of speech.  He takes Tylenol as needed up to 3 or 4 times a day, 2 pills each time.  He does not take it daily.  He drinks caffeine in the form of soda, about 24 ounces per day, no coffee, does not drink alcohol regularly, rarely in fact.  He admits that he does not always hydrate well enough.  His creatinine level on 05/11/2021 was 1.29. He is a non-smoker. He does not sleep well.  He estimates that he only gets about 6 hours of sleep on a given night.  He has trouble maintaining sleep and snores.  He has nocturia about 2-3 times per average  night.  He is not aware of any family history of sleep apnea but reports that his brother may have had a sleep study. He has himself never had a sleep study but would be willing to get checked out.  He is divorced and lives alone, has joint custody of his 3 children who stay with him a week at a time.  He has 1 dog in the household.   I reviewed the emergency room records.  Headache was fairly sudden in onset in the morning, mostly on the left occipital side.  He had some light sensitivity.  No exertion or prodromal illness was reported at the time.  He had not tried any medications.  He had a head CT without contrast on 05/11/2021 and I reviewed the results: Impression:  IMPRESSION: No evidence of acute intracranial abnormality.   Retrocerebellar CSF density prominence, as described. 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   Mild/moderate mucosal thickening within the imaged left maxillary sinus.  In addition, I personally reviewed the images through the PACS system.  He was treated symptomatically with Benadryl, Reglan, IV fluids, magnesium and acetaminophen.  He had a nonfocal examination but reported having tremors earlier.  He does not currently have a family doctor or primary care physician.  His Past Medical History Is Significant  For: Past Medical History:  Diagnosis Date  . Cancer ([place])    skin  . Headache   . Kidney stones   . Renal disorder     His Past Surgical History Is Significant For: Past Surgical History:  Procedure Laterality Date  . CYSTOSCOPY W/ URETERAL STENT PLACEMENT Bilateral 01/04/2016   Procedure: CYSTOSCOPY WITH STENT REPLACEMENT;  Surgeon: Hollice Espy, MD;  Location: [facility];  Service: Urology;  Laterality: Bilateral;  . CYSTOSCOPY WITH STENT PLACEMENT Bilateral 12/02/2015   Procedure: CYSTOSCOPY WITH STENT PLACEMENT,bilateral retrograde pylorograms;  Surgeon: Hollice Espy, MD;  Location: [facility];  Service: Urology;   Laterality: Bilateral;  . HERNIA REPAIR    . NEPHROLITHOTOMY N/A 02/29/2016   Procedure: NEPHROLITHOTOMY PERCUTANEOUS cystoscopy right stent removal;  Surgeon: Hollice Espy, MD;  Location: [facility] OR[facility];  Service: Urology;  Laterality: N/A;  . URETEROSCOPY WITH HOLMIUM LASER LITHOTRIPSY Left 12/02/2015   Procedure: URETEROSCOPY WITH HOLMIUM LASER LITHOTRIPSY WITH RETROGRADE PYLOGRAM;  Surgeon: Hollice Espy, MD;  Location: [facility];  Service: Urology;  Laterality: Left;  . URETEROSCOPY WITH HOLMIUM LASER LITHOTRIPSY Left 01/04/2016   Procedure: URETEROSCOPY WITH HOLMIUM LASER LITHOTRIPSY;  Surgeon: Hollice Espy, MD;  Location: [facility];  Service: Urology;  Laterality: Left;    His Family History Is Significant For: Family History  Problem Relation Age of Onset  . Healthy Mother   . Healthy Father   . Neuropathy Father   . Multiple sclerosis Sister   . Colitis Brother   . Other Brother        poss mini stroke    His Social History Is Significant For: Social History   Socioeconomic History  . Marital status: Divorced    Spouse name: Not on file  . Number of children: 3  . Years of education: Not on file  . Highest education level: High school graduate  Occupational History  . Not on file  Tobacco Use  . Smoking status: Never Smoker  . Smokeless tobacco: Never Used  Vaping Use  . Vaping Use: Never used  Substance and Sexual Activity  . Alcohol use: Yes    Comment: occassional  . Drug use: No  . Sexual activity: Not on file  Other Topics Concern  . Not on file  Social History Narrative   Caffeine- 2 12 oz Cokes   Social Determinants of Health   Financial Resource Strain: Not on file  Food Insecurity: Not on file  Transportation Needs: Not on file  Physical Activity: Not on file  Stress: Not on file  Social Connections: Not on file    His Allergies Are:  Allergies  Allergen Reactions  . Fentanyl Other (See Comments)    Change in mentation for long period of time   :   His Current Medications Are:  Outpatient Encounter Medications as of 05/20/2021  Medication Sig  . [DISCONTINUED] docusate sodium (COLACE) 100 MG capsule Take 1 capsule (100 mg total) by mouth 2 (two) times daily. (Patient not taking: Reported on 04/13/2016)  . [DISCONTINUED] HYDROcodone-acetaminophen (NORCO/VICODIN) 5-325 MG tablet Take 1-2 tablets by mouth every 6 (six) hours as needed for moderate pain. (Patient not taking: Reported on 04/13/2016)   No facility-administered encounter medications on file as of 05/20/2021.  :   Review of Systems:  Out of a complete 14 point review of systems, all are reviewed and negative with the exception of these symptoms as listed below:   Review of Systems  Neurological:       New patient  with sudden onset of severe headache in back of head, arms, legs were shaking. Headache hasn't gone away, not as bad. Never had a migraine. Tylenol as needed with fair relief.   Epworth Sleepiness Scale 0= would never doze 1= slight chance of dozing 2= moderate chance of dozing 3= high chance of dozing  Sitting and reading: Watching TV: Sitting inactive in a public place (ex. Theater or meeting): As a passenger in a car for an hour without a break: Lying down to rest in the afternoon: Sitting and talking to someone: Sitting quietly after lunch (no alcohol): In a car, while stopped in traffic: Total:     Objective:  Neurological Exam  Physical Exam Physical Examination:   Vitals:   05/20/21 1444  BP: (!) 147/93  Pulse: (!) 106    General Examination: The patient is a very pleasant 50 y.o. male in no acute distress. He appears well-developed and well-nourished and well groomed.   HEENT: Normocephalic, atraumatic, pupils are equal, round and reactive to light and accommodation. Funduscopic exam is a little difficult, he has mild bilateral cataracts.  He has corrective eyeglasses.  Tracking is well-preserved, no nystagmus seen.  Hearing is  grossly intact. Face is symmetric with normal facial animation and normal facial sensation. Speech is clear with no dysarthria noted. There is no hypophonia. There is no lip, neck/head, jaw or voice tremor. Neck is supple with full range of passive and active motion. There are no carotid bruits on auscultation. Oropharynx exam reveals: moderate mouth dryness, adequate dental hygiene and moderate airway crowding, due to somewhat wider uvula, Mallampati class III. Tongue protrudes centrally and palate elevates symmetrically.   Chest: Clear to auscultation without wheezing, rhonchi or crackles noted.  Heart: S1+S2+0, regular and normal without murmurs, rubs or gallops noted.   Abdomen: Soft, non-tender and non-distended.  Extremities: There is no pitting edema in the distal lower extremities bilaterally. Pedal pulses are intact.  Skin: Warm and dry without trophic changes noted.  Musculoskeletal: exam reveals no obvious joint deformities, tenderness or joint swelling or erythema.   Neurologically:  Mental status: The patient is awake, alert and oriented in all 4 spheres. His immediate and remote memory, attention, language skills and fund of knowledge are appropriate. There is no evidence of aphasia, agnosia, apraxia or anomia. Speech is clear with normal prosody and enunciation. Thought process is linear. Mood is normal and affect is normal.  Cranial nerves II - XII are as described above under HEENT exam. In addition: shoulder shrug is normal with equal shoulder height noted. Motor exam: Normal bulk, strength and tone is noted. There is no drift, tremor or rebound. Romberg is negative. Reflexes are 2+ throughout. Babinski: Toes are flexor bilaterally. Fine motor skills and coordination: intact with normal finger taps, normal hand movements, normal rapid alternating patting, normal foot taps and normal foot agility.  Cerebellar testing: No dysmetria or intention tremor on finger to nose testing. Heel  to shin is unremarkable bilaterally. There is no truncal or gait ataxia.  Sensory exam: intact to light touch, pinprick, vibration, temperature sense in the upper and lower extremities.  Gait, station and balance: He stands easily. No veering to one side is noted. No leaning to one side is noted. Posture is age-appropriate and stance is narrow based. Gait shows normal stride length and normal pace. No problems turning are noted. Tandem walk is unremarkable.                Assessment and Plan:  In summary, HESSTON HITCHENS is a very pleasant 50 y.o.-year old male  with an underlying medical history of kidney stones, basal cell cancer, and overweight state, who presents for evaluation of his recurrent headaches, which started about a week ago, fairly abruptly.  Differential diagnosis includes headache related to blood pressure elevation, tension headache, occipital neuralgia, unlikely migraines, unlikely cluster headaches.  Underlying obstructive sleep apnea is a likely possibility as well.  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  Neurological exam is nonfocal and reassuring.  For symptomatic treatment of his headaches I would like to start him on amitriptyline low-dose with gradual increase.  It may help him attain a little bit more consolidated sleep as well.  For further evaluation of his headaches I would like to proceed with a brain MRI with and without contrast.  He requests an open MRI and reports claustrophobia.  He is advised to stay better hydrated with water and we will repeat his chemistry panel today as his creatinine level was mildly elevated about a week ago.  I would like to see it improved before we proceed with a brain MRI with and without contrast.  We may consider input from neurosurgery regarding the arachnoid cyst.  He is largely reassured today.  We talked about headache triggers.  He is advised to stay well rested, well hydrated and we talked about sleep apnea, its  prognosis and treatment options.  He is advised to proceed with sleep study.  He is willing to consider CPAP or AutoPap therapy.  He was given detailed written instructions and a new prescription for amitriptyline and I ordered a brain MRI as well as a sleep study.  We will call him to schedule these.  He is advised to follow-up in this clinic to see one of our nurse practitioners in 3 months, sooner if needed.  He will start with amitriptyline 25 mg strength half a pill at bedtime for the first week and increase weekly to a total of 50 mg eventually.  He is advised to establish care with a new primary care physician or family doctor.  His blood pressure was elevated today and has been slightly elevated in the past couple of years.  I answered all his questions today and he was in agreement with the plan. Star Age, MD, PhD

## 2021-05-20 NOTE — Patient Instructions (Addendum)
It was nice to meet you today!   Here is what we discussed today and what we came up with as our plan for you:    1. For your recurrent headache, we will start you on a daily prevention medication called Elavil (generic name: amitriptyline) 25 mg: Take half a pill daily at bedtime for one week, then one pill daily at bedtime for one week, then one and a half pills daily at bedtime for one week, then 2 pills daily at bedtime thereafter. Common side effects reported are: mouth dryness, drowsiness, confusion, dizziness.   2. We will do a brain scan, called MRI and call you with the test results. We will have to schedule you for this on a separate date. This test requires authorization from your insurance, and we will take care of the insurance process.  This scan is more detail oriented and we will do it with contrast.  For your arachnoid cyst, we may get input from a neurosurgeon after your scan.  3.  Please try to drink more water, your kidney function test from last week indicated slight increase in creatinine level which can indicate mild dysfunction of your kidney function.  In order to safely give you MRI contrast, your kidney function has to be optimized.  We will recheck your blood chemistry panel today.  4. Based on your symptoms and your exam I believe you are at risk for obstructive sleep apnea (aka OSA), and I think we should proceed with a sleep study to determine whether you do or do not have OSA and how severe it is. Even, if you have mild OSA, I may want you to consider treatment with CPAP, as treatment of even borderline or mild sleep apnea can result and improvement of symptoms such as sleep disruption, daytime sleepiness, nighttime bathroom breaks, restless leg symptoms, improvement of headache syndromes, even improved mood disorder.   As explained, an attended sleep study meaning you get to stay overnight in the sleep lab, lets [place] monitor sleep-related behaviors such as sleep talking and  leg movements in sleep, in addition to monitoring for sleep apnea.  A home sleep test is a screening tool for sleep apnea only, and unfortunately does not help with any other sleep-related diagnoses.  Please remember, the long-term risks and ramifications of untreated moderate to severe obstructive sleep apnea are: increased Cardiovascular disease, including congestive heart failure, stroke, difficult to control hypertension, treatment resistant obesity, arrhythmias, especially irregular heartbeat commonly known as A. Fib. (atrial fibrillation); even type 2 diabetes has been linked to untreated OSA.   Sleep apnea can cause disruption of sleep and sleep deprivation in most cases, which, in turn, can cause recurrent headaches, problems with memory, mood, concentration, focus, and vigilance. Most people with untreated sleep apnea report excessive daytime sleepiness, which can affect their ability to drive. Please do not drive if you feel sleepy. Patients with sleep apnea can also develop difficulty initiating and maintaining sleep (aka insomnia).   Having sleep apnea may increase your risk for other sleep disorders, including involuntary behaviors sleep such as sleep terrors, sleep talking, sleepwalking.    Having sleep apnea can also increase your risk for restless leg syndrome and leg movements at night.   Please note that untreated obstructive sleep apnea may carry additional perioperative morbidity. Patients with significant obstructive sleep apnea (typically, in the moderate to severe degree) should receive, if possible, perioperative PAP (positive airway pressure) therapy and the surgeons and particularly the anesthesiologists should be  informed of the diagnosis and the severity of the sleep disordered breathing.   I will likely see you back after your sleep study to go over the test results and where to go from there. We will call you after your sleep study to advise about the results (most likely,  you will hear from [facility], my nurse) and to set up an appointment at the time, as necessary.    Our sleep lab administrative assistant will call you to schedule your sleep study and give you further instructions, regarding the check in process for the sleep study, arrival time, what to bring, when you can expect to leave after the study, etc., and to answer any other logistical questions you may have. If you don't hear back from her by about 2 weeks from now, please feel free to call her direct line at [phone] or you can call our general clinic number, or email [place] through My Chart.

## 2021-05-20 NOTE — Progress Notes (Deleted)
Subjective:    Patient ID: Christian Pace is a 50 y.o. male.  Headache       Star Age, MD, PhD [facility] [address], [address] P[address] [place], [place] [address]  Dear Dr. Marland Kitchen   I saw your patient, Christian Pace, upon your kind request in my neurologic clinic today for initial consultation of {Desc; his/her:32168} ***. The patient is unaccompanied today. As you know, the patient is a very pleasant 50 y.o.-year-old {Handed:22697}-handed {Desc; male/male:11659}, with an underlying history of ***, who presents with a {NUMBERS 4:[id number]} [place] DAYS/WEEKS/MONTHS:109061} history of ***. Symptom onset was {abrupt, gradual:20671} and are now {stable, gradual worsening, rapidly worsening:3049032}. Associated symptoms include ***.   {Desc; his/her:32168} Past Medical History Is Significant For: Past Medical History:  Diagnosis Date  . Cancer ([place])    skin  . Headache   . Kidney stones   . Renal disorder     {Desc; his/her:32168} Past Surgical History Is Significant For: Past Surgical History:  Procedure Laterality Date  . CYSTOSCOPY W/ URETERAL STENT PLACEMENT Bilateral 01/04/2016   Procedure: CYSTOSCOPY WITH STENT REPLACEMENT;  Surgeon: Hollice Espy, MD;  Location: [facility];  Service: Urology;  Laterality: Bilateral;  . CYSTOSCOPY WITH STENT PLACEMENT Bilateral 12/02/2015   Procedure: CYSTOSCOPY WITH STENT PLACEMENT,bilateral retrograde pylorograms;  Surgeon: Hollice Espy, MD;  Location: [facility];  Service: Urology;  Laterality: Bilateral;  . HERNIA REPAIR    . NEPHROLITHOTOMY N/A 02/29/2016   Procedure: NEPHROLITHOTOMY PERCUTANEOUS cystoscopy right stent removal;  Surgeon: Hollice Espy, MD;  Location: [facility];  Service: Urology;  Laterality: N/A;  . URETEROSCOPY WITH HOLMIUM LASER LITHOTRIPSY Left 12/02/2015   Procedure: URETEROSCOPY WITH HOLMIUM LASER LITHOTRIPSY WITH RETROGRADE PYLOGRAM;  Surgeon: Hollice Espy, MD;  Location: [facility];   Service: Urology;  Laterality: Left;  . URETEROSCOPY WITH HOLMIUM LASER LITHOTRIPSY Left 01/04/2016   Procedure: URETEROSCOPY WITH HOLMIUM LASER LITHOTRIPSY;  Surgeon: Hollice Espy, MD;  Location: [facility];  Service: Urology;  Laterality: Left;    {Desc; his/her:32168} Family History Is Significant For: Family History  Problem Relation Age of Onset  . Healthy Mother   . Healthy Father   . Neuropathy Father   . Multiple sclerosis Sister   . Colitis Brother   . Other Brother        poss mini stroke    {Desc; his/her:32168} Social History Is Significant For: Social History   Socioeconomic History  . Marital status: Divorced    Spouse name: Not on file  . Number of children: 3  . Years of education: Not on file  . Highest education level: High school graduate  Occupational History  . Not on file  Tobacco Use  . Smoking status: Never Smoker  . Smokeless tobacco: Never Used  Vaping Use  . Vaping Use: Never used  Substance and Sexual Activity  . Alcohol use: Yes    Comment: occassional  . Drug use: No  . Sexual activity: Not on file  Other Topics Concern  . Not on file  Social History Narrative   Caffeine- 2 12 oz Cokes   Social Determinants of Health   Financial Resource Strain: Not on file  Food Insecurity: Not on file  Transportation Needs: Not on file  Physical Activity: Not on file  Stress: Not on file  Social Connections: Not on file    {Desc; his/her:32168} Allergies Are:  Allergies  Allergen Reactions  . Fentanyl Other (See Comments)    Change in mentation for  long period of time  :   {Desc; his/her:32168} Current Medications Are:  Outpatient Encounter Medications as of 05/20/2021  Medication Sig  . [DISCONTINUED] docusate sodium (COLACE) 100 MG capsule Take 1 capsule (100 mg total) by mouth 2 (two) times daily. (Patient not taking: Reported on 04/13/2016)  . [DISCONTINUED] HYDROcodone-acetaminophen (NORCO/VICODIN) 5-325 MG tablet Take 1-2 tablets by  mouth every 6 (six) hours as needed for moderate pain. (Patient not taking: Reported on 04/13/2016)   No facility-administered encounter medications on file as of 05/20/2021.  :   Review of Systems:  Out of a complete 14 point review of systems, all are reviewed and negative with the exception of these symptoms as listed below:  Review of Systems  Neurological: Positive for headaches.       New patient with sudden onset of severe headache in back of head assoc with arm, leg weakness. Headache has lingered, Tylenol as needed with fair relief.   Epworth Sleepiness Scale 0= would never doze 1= slight chance of dozing 2= moderate chance of dozing 3= high chance of dozing  Sitting and reading:1 Watching TV:2 Sitting inactive in a public place (ex. Theater or meeting):0 As a passenger in a car for an hour without a break:0 Lying down to rest in the afternoon:1 Sitting and talking to someone:0 Sitting quietly after lunch (no alcohol):0 In a car, while stopped in traffic:0 Total:4    Objective:  Neurological Exam  Physical Exam  Assessment:   ***  Plan:   ***

## 2021-05-21 LAB — COMPREHENSIVE METABOLIC PANEL
ALT: 17 IU/L (ref 0–44)
AST: 15 IU/L (ref 0–40)
Albumin/Globulin Ratio: 1.7 (ref 1.2–2.2)
Albumin: 4.5 g/dL (ref 4.0–5.0)
Alkaline Phosphatase: 80 IU/L (ref 44–121)
BUN/Creatinine Ratio: 9 (ref 9–20)
BUN: 11 mg/dL (ref 6–24)
Bilirubin Total: 0.4 mg/dL (ref 0.0–1.2)
CO2: 23 mmol/L (ref 20–29)
Calcium: 9.2 mg/dL (ref 8.7–10.2)
Chloride: 105 mmol/L (ref 96–106)
Creatinine, Ser: 1.21 mg/dL (ref 0.76–1.27)
Globulin, Total: 2.6 g/dL (ref 1.5–4.5)
Glucose: 96 mg/dL (ref 65–99)
Potassium: 4.3 mmol/L (ref 3.5–5.2)
Sodium: 143 mmol/L (ref 134–144)
Total Protein: 7.1 g/dL (ref 6.0–8.5)
eGFR: 74 mL/min/{1.73_m2} (ref >59)

## 2021-05-24 ENCOUNTER — Telehealth: Payer: Self-pay | Admitting: Neurology

## 2021-05-24 NOTE — Telephone Encounter (Signed)
MRI brain w/wo contrast Christian Pace: [id number] (05/24/21-06/22/21)   Sent to [facility] for scheduling

## 2021-05-25 MED ORDER — ALPRAZOLAM 0.25 MG PO TABS: ORAL_TABLET | ORAL | 0 refills | Status: DC

## 2021-05-25 NOTE — Telephone Encounter (Signed)
Spoke with Larene Beach at [facility]. Patient is scheduled on 6/14 at 8:45. He requests medication for claustrophobia.

## 2021-05-25 NOTE — Telephone Encounter (Signed)
See mychart message from 05/25/21 in regards to this. Xanax 0.25 # 3 has been sent for the pt to take prior to study

## 2021-05-25 NOTE — Telephone Encounter (Signed)
Pt is asking for medication to help with coming MRI. Rx for 0.25 Xanax # 3 has been placed. 1-2 tablet 30-45 mins prior to MRI and 1 additional tablet at the time of MRI if needed. Will send to MD for sign if appropriate.

## 2021-06-03 ENCOUNTER — Telehealth: Payer: Self-pay | Admitting: Neurology

## 2021-06-03 NOTE — Telephone Encounter (Signed)
I received his brain MRI report.  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  A few nonspecific small T2 hyperintensities are noted in the white matter of doubtful significance.  Otherwise unremarkable MRI of the brain.  Please see my chart message for reference.  Please call patient and advise him that his brain MRI with and without contrast from 06/01/2021 showed no acute findings, presence of arachnoid cysts has been described again.  No abnormal contrast uptake, no mass or lesion was seen.  Overall dimensions seem to be a little bit smaller than what was described on his recent CT scan but it is difficult to compare CT to MRI so slight discrepancy is possible.  No immediate action is required, he can follow-up as scheduled.  We will proceed with sleep testing as discussed.

## 2021-06-03 NOTE — Telephone Encounter (Signed)
See my chart message from 06/03/21.

## 2021-07-26 ENCOUNTER — Telehealth: Payer: Self-pay | Admitting: Neurology

## 2021-07-26 NOTE — Telephone Encounter (Signed)
Tried to leave VM for pt to call me back to schedule sleep study but VM full

## 2021-08-02 ENCOUNTER — Ambulatory Visit: Payer: BC Managed Care – PPO | Admitting: Neurology

## 2021-08-04 ENCOUNTER — Ambulatory Visit: Payer: Self-pay | Admitting: Neurology

## 2021-08-05 ENCOUNTER — Telehealth: Payer: Self-pay

## 2021-08-05 NOTE — Telephone Encounter (Signed)
Unable to LVM for pt to call me back to schedule sleep study. Pt's vm box is still full.   We have attempted to call the patient two times to schedule sleep study.  Patient has been unavailable at the phone numbers we have on file and has not returned our calls.  If patient calls back we will schedule them for their sleep study.

## 2021-08-25 NOTE — Progress Notes (Deleted)
No chief complaint on file.    HISTORY OF PRESENT ILLNESS:  08/25/21 ALL:  Christian Pace is a 50 y.o. male here today for follow up for headaches. He was seen in consult with Dr Rexene Alberts 05/2021. He reported abrupt onset of a sharp, stabbing headache of left occipital area. CT showed "Retrocerebellar CSF density prominence, as described. This may reflect a mega cisterna magna or posterior fossa arachnoid cyst". MRI was ordered but has not yet been completed. HST also ordered due to concerns of snoring, EDS and nocturia but has not been completed.   He was started on amitriptyline '25mg'$  daily with plans to increase to '50mg'$  daily if well tolerated.    HISTORY (copied from Dr Guadelupe Sabin previous note)  I saw patient, Christian Pace, as a referral from the emergency room for evaluation of his headaches.  The patient is unaccompanied today.  [name] is a 50 year old right-handed gentleman with an underlying medical history of kidney stones, basal cell cancer, and overweight state, who presented to the emergency room on 05/11/2021 with new onset headaches.    He reports that the headache started fairly abruptly, he was at work at the time.  It was behind the left ear and in the occipital area, was sharp and stabbing, fairly constant at times, not associated with nausea or vomiting or photophobia or sonophobia.  He did have some trembling in his hands while at work as noticed by his boss.  EMS was called.   He does not have a history of headaches except for sinus congestion and frontal pressure-like headaches at times.  He did not have migraines growing up.  He denies any previous evidence of an arachnoid cyst, reports that he had a brain MRI some 18 years ago and it was not mentioned at the time.  He reports that his sister has MS and he had worried about MS symptoms at the time.   He denies any sudden onset of one-sided weakness or numbness or tingling or droopy face or slurring of speech.  He takes  Tylenol as needed up to 3 or 4 times a day, 2 pills each time.  He does not take it daily.  He drinks caffeine in the form of soda, about 24 ounces per day, no coffee, does not drink alcohol regularly, rarely in fact.  He admits that he does not always hydrate well enough.  His creatinine level on 05/11/2021 was 1.29.  He is a non-smoker.  He does not sleep well.  He estimates that he only gets about 6 hours of sleep on a given night.  He has trouble maintaining sleep and snores.  He has nocturia about 2-3 times per average night.  He is not aware of any family history of sleep apnea but reports that his brother may have had a sleep study. He has himself never had a sleep study but would be willing to get checked out.  He is divorced and lives alone, has joint custody of his 3 children who stay with him a week at a time.  He has 1 dog in the household.    I reviewed the emergency room records.  Headache was fairly sudden in onset in the morning, mostly on the left occipital side.  He had some light sensitivity.  No exertion or prodromal illness was reported at the time.  He had not tried any medications.  He had a head CT without contrast on 05/11/2021 and I reviewed the results:  IMPRESSION: No evidence of acute intracranial abnormality.   Retrocerebellar CSF density prominence, as described. 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   Mild/moderate mucosal thickening within the imaged left maxillary sinus.  In addition, I personally reviewed the images through the PACS system.   He was treated symptomatically with Benadryl, Reglan, IV fluids, magnesium and acetaminophen.  He had a nonfocal examination but reported having tremors earlier.   He does not currently have a family doctor or primary care physician.   REVIEW OF SYSTEMS: Out of a complete 14 system review of symptoms, the patient complains only of the following symptoms, headaches and all other reviewed  systems are negative.   ALLERGIES: Allergies  Allergen Reactions   Fentanyl Other (See Comments)    Change in mentation for long period of time     HOME MEDICATIONS: Outpatient Medications Prior to Visit  Medication Sig Dispense Refill   ALPRAZolam (XANAX) 0.25 MG tablet For MRI study- Take 1-2 tablets 30-45 minutes prior to MRI 1 additional tablet can be used right before MRI if needed. 3 tablet 0   amitriptyline (ELAVIL) 25 MG tablet Take 2 tablets (50 mg total) by mouth at bedtime. 60 tablet 3   No facility-administered medications prior to visit.     PAST MEDICAL HISTORY: Past Medical History:  Diagnosis Date   Cancer ([place])    skin   Headache    Kidney stones    Renal disorder      PAST SURGICAL HISTORY: Past Surgical History:  Procedure Laterality Date   CYSTOSCOPY W/ URETERAL STENT PLACEMENT Bilateral 01/04/2016   Procedure: CYSTOSCOPY WITH STENT REPLACEMENT;  Surgeon: Hollice Espy, MD;  Location: [facility];  Service: Urology;  Laterality: Bilateral;   CYSTOSCOPY WITH STENT PLACEMENT Bilateral 12/02/2015   Procedure: CYSTOSCOPY WITH STENT PLACEMENT,bilateral retrograde pylorograms;  Surgeon: Hollice Espy, MD;  Location: [facility];  Service: Urology;  Laterality: Bilateral;   HERNIA REPAIR     NEPHROLITHOTOMY N/A 02/29/2016   Procedure: NEPHROLITHOTOMY PERCUTANEOUS cystoscopy right stent removal;  Surgeon: Hollice Espy, MD;  Location: [facility];  Service: Urology;  Laterality: N/A;   URETEROSCOPY WITH HOLMIUM LASER LITHOTRIPSY Left 12/02/2015   Procedure: URETEROSCOPY WITH HOLMIUM LASER LITHOTRIPSY WITH RETROGRADE PYLOGRAM;  Surgeon: Hollice Espy, MD;  Location: [facility];  Service: Urology;  Laterality: Left;   URETEROSCOPY WITH HOLMIUM LASER LITHOTRIPSY Left 01/04/2016   Procedure: URETEROSCOPY WITH HOLMIUM LASER LITHOTRIPSY;  Surgeon: Hollice Espy, MD;  Location: [facility];  Service: Urology;  Laterality: Left;     FAMILY HISTORY: Family History  Problem  Relation Age of Onset   Healthy Mother    Healthy Father    Neuropathy Father    Multiple sclerosis Sister    Colitis Brother    Other Brother        poss mini stroke     SOCIAL HISTORY: Social History   Socioeconomic History   Marital status: Divorced    Spouse name: Not on file   Number of children: 3   Years of education: Not on file   Highest education level: High school graduate  Occupational History   Not on file  Tobacco Use   Smoking status: Never   Smokeless tobacco: Never  Vaping Use   Vaping Use: Never used  Substance and Sexual Activity   Alcohol use: Yes    Comment: occassional   Drug use: No   Sexual activity: Not on file  Other Topics Concern   Not  on file  Social History Narrative   Caffeine- 2 12 oz Cokes   Social Determinants of Health   Financial Resource Strain: Not on file  Food Insecurity: Not on file  Transportation Needs: Not on file  Physical Activity: Not on file  Stress: Not on file  Social Connections: Not on file  Intimate Partner Violence: Not on file     PHYSICAL EXAM  There were no vitals filed for this visit. There is no height or weight on file to calculate BMI.   Generalized: Well developed, in no acute distress  Cardiology: normal rate and rhythm, no murmur auscultated  Respiratory: clear to auscultation bilaterally    Neurological examination  Mentation: Alert oriented to time, place, history taking. Follows all commands speech and language fluent Cranial nerve II-XII: Pupils were equal round reactive to light. Extraocular movements were full, visual field were full on confrontational test. Facial sensation and strength were normal. Uvula tongue midline. Head turning and shoulder shrug  were normal and symmetric. Motor: The motor testing reveals 5 over 5 strength of all 4 extremities. Good symmetric motor tone is noted throughout.  Sensory: Sensory testing is intact to soft touch on all 4 extremities. No evidence of  extinction is noted.  Coordination: Cerebellar testing reveals good finger-nose-finger and heel-to-shin bilaterally.  Gait and station: Gait is normal. Tandem gait is normal. Romberg is negative. No drift is seen.  Reflexes: Deep tendon reflexes are symmetric and normal bilaterally.    DIAGNOSTIC DATA (LABS, IMAGING, TESTING) - I reviewed patient records, labs, notes, testing and imaging myself where available.  Lab Results  Component Value Date   WBC 8.1 02/18/2016   HGB 11.9 (L) 03/01/2016   HCT 36.1 (L) 03/01/2016   MCV 85.4 02/18/2016   PLT 272 02/18/2016      Component Value Date/Time   NA 143 05/20/2021 1604   NA 138 05/04/2013 1236   K 4.3 05/20/2021 1604   K 4.0 05/04/2013 1236   CL 105 05/20/2021 1604   CL 106 05/04/2013 1236   CO2 23 05/20/2021 1604   CO2 27 05/04/2013 1236   GLUCOSE 96 05/20/2021 1604   GLUCOSE 104 (H) 05/11/2021 1414   GLUCOSE 119 (H) 05/04/2013 1236   BUN 11 05/20/2021 1604   BUN 12 05/04/2013 1236   CREATININE 1.21 05/20/2021 1604   CREATININE 1.33 (H) 05/04/2013 1236   CALCIUM 9.2 05/20/2021 1604   CALCIUM 8.8 05/04/2013 1236   PROT 7.1 05/20/2021 1604   PROT 7.7 05/04/2013 1236   ALBUMIN 4.5 05/20/2021 1604   ALBUMIN 4.1 05/04/2013 1236   AST 15 05/20/2021 1604   AST 20 05/04/2013 1236   ALT 17 05/20/2021 1604   ALT 22 05/04/2013 1236   ALKPHOS 80 05/20/2021 1604   ALKPHOS 78 05/04/2013 1236   BILITOT 0.4 05/20/2021 1604   BILITOT 0.5 05/04/2013 1236   GFRNONAA >60 05/11/2021 1414   GFRNONAA >60 05/04/2013 1236   GFRAA >60 03/01/2016 0322   GFRAA >60 05/04/2013 1236   No results found for: CHOL, HDL, LDLCALC, LDLDIRECT, TRIG, CHOLHDL No results found for: HGBA1C No results found for: VITAMINB12 No results found for: TSH  No flowsheet data found.   No flowsheet data found.   ASSESSMENT AND PLAN  50 y.o. year old male  has a past medical history of Cancer ([place]), Headache, Kidney stones, and Renal disorder. here with     No diagnosis found.   No orders of the defined types were placed in  this encounter.    No orders of the defined types were placed in this encounter.     Debbora Presto, MSN, FNP-C 08/25/2021, 1:07 PM  [facility] [address] [place], [place] [address] [phone]

## 2021-08-26 ENCOUNTER — Ambulatory Visit: Payer: BC Managed Care – PPO | Admitting: Family Medicine

## 2021-08-26 ENCOUNTER — Encounter: Payer: Self-pay | Admitting: Family Medicine

## 2021-08-26 DIAGNOSIS — R519 Headache, unspecified: Secondary | ICD-10-CM

## 2021-12-26 ENCOUNTER — Encounter: Payer: Self-pay | Admitting: Neurology

## 2022-02-17 IMAGING — CT CT HEAD W/O CM
3 series · 15 of 47 positions shown, 18 images · non-contrast
Comparison: No pertinent prior exams available for comparison.

CLINICAL DATA: Headache, new or worsening. Sudden onset headache
2.5 hours ago.

EXAM:
CT HEAD WITHOUT CONTRAST
TECHNIQUE: Contiguous axial images were obtained from the base of the skull
through the vertex without intravenous contrast.

[Series 2: head wo · axial · 0.50mm/px · z∈[-119,+36]mm · 9 of 37 slices shown, 12 images]
[im 3/37  brain]
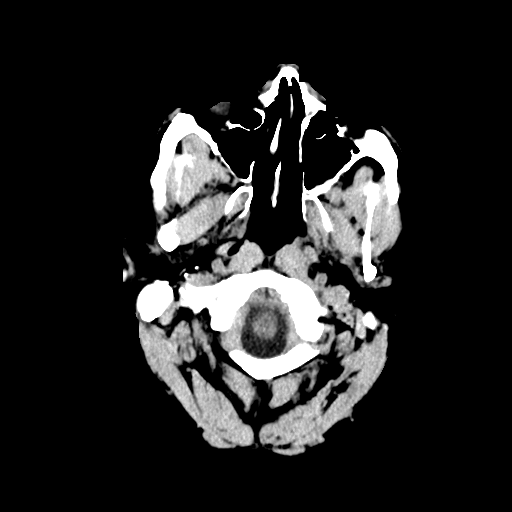
[im 3/37  bone]
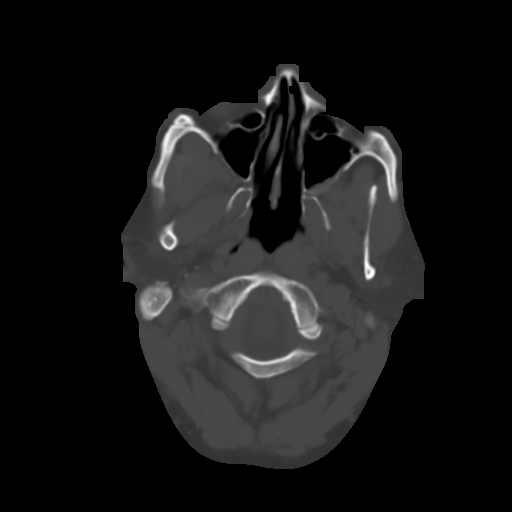
[im 7/37  brain]
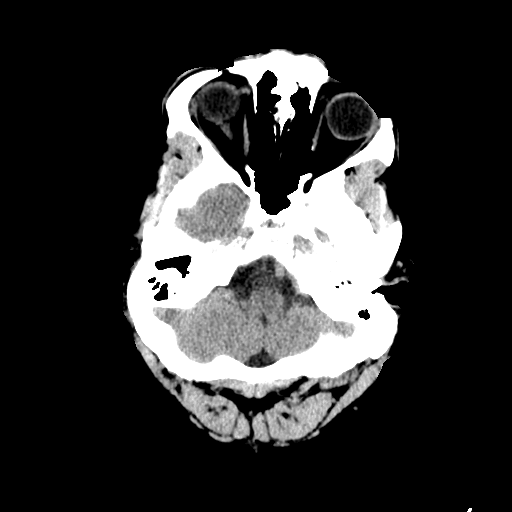
[im 10/37  brain]
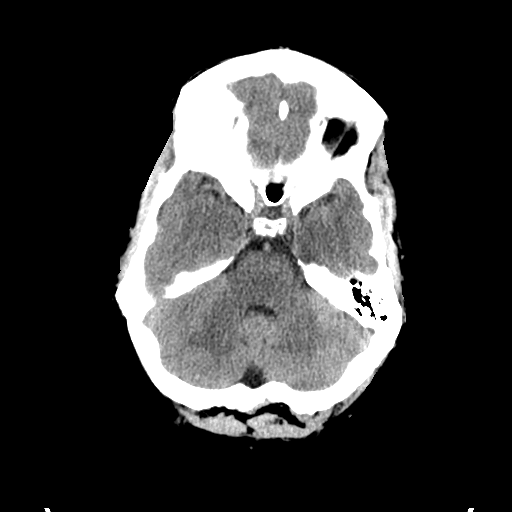
[im 14/37  brain]
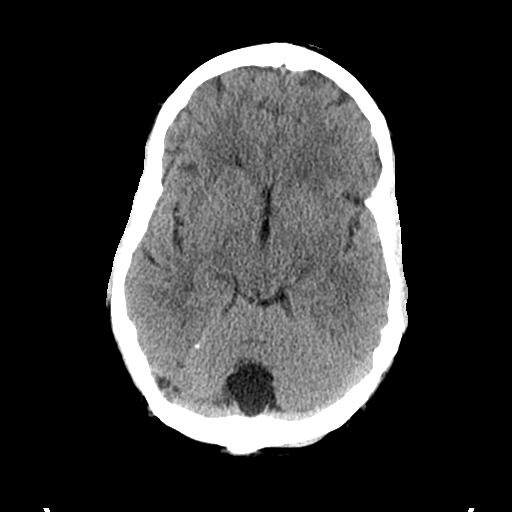
[im 19/37  brain]
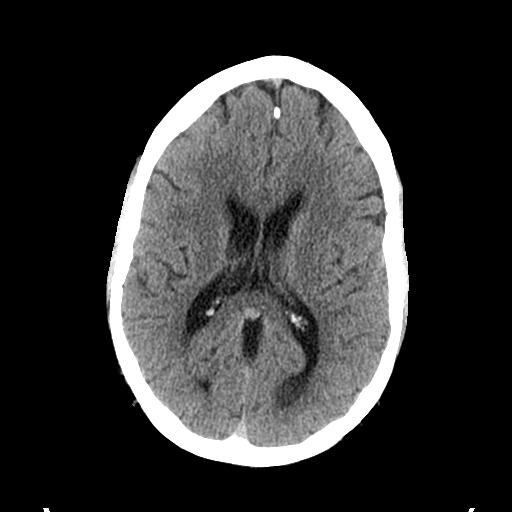
[im 19/37  bone]
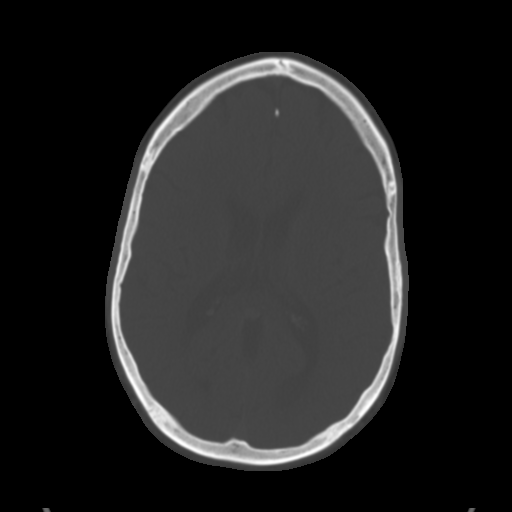
[im 23/37  brain]
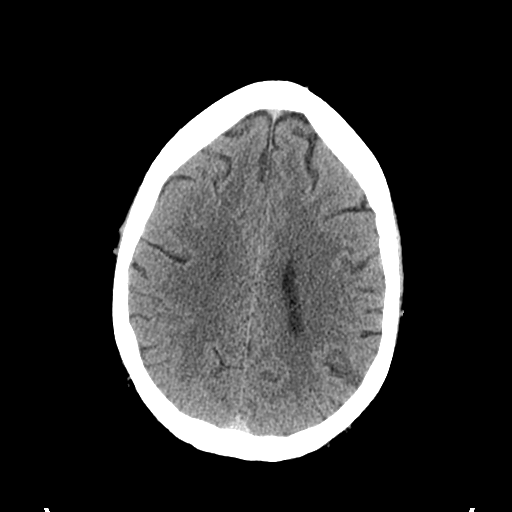
[im 27/37  brain]
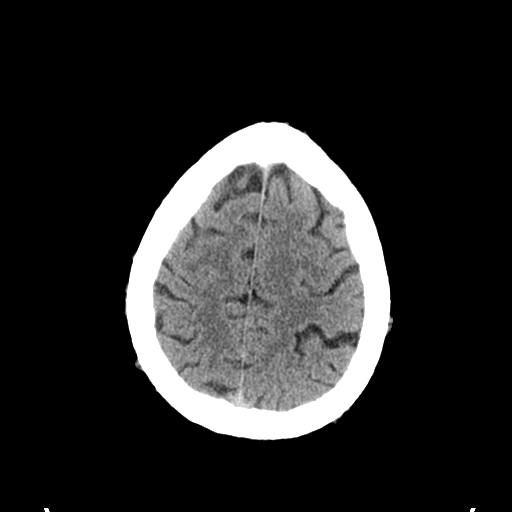
[im 30/37  brain]
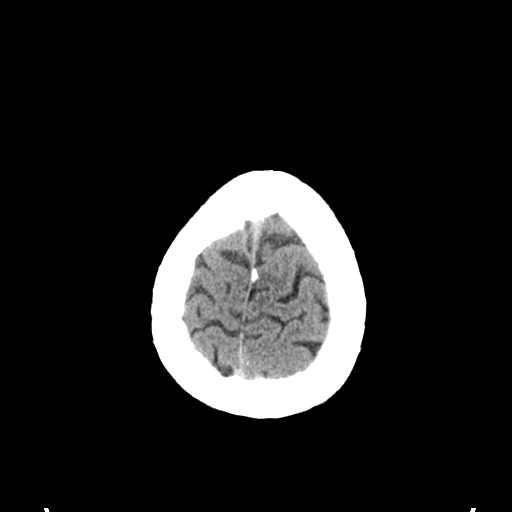
[im 34/37  brain]
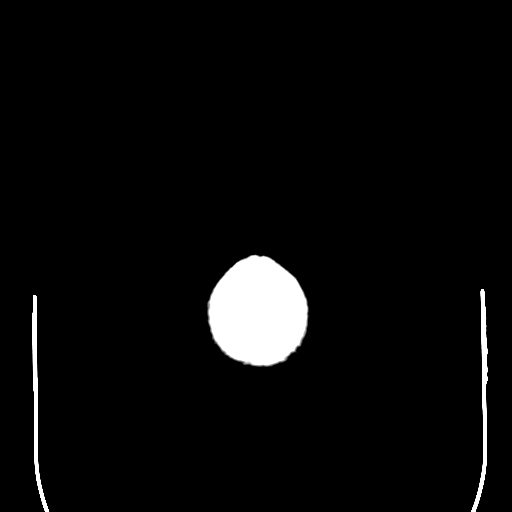
[im 34/37  bone]
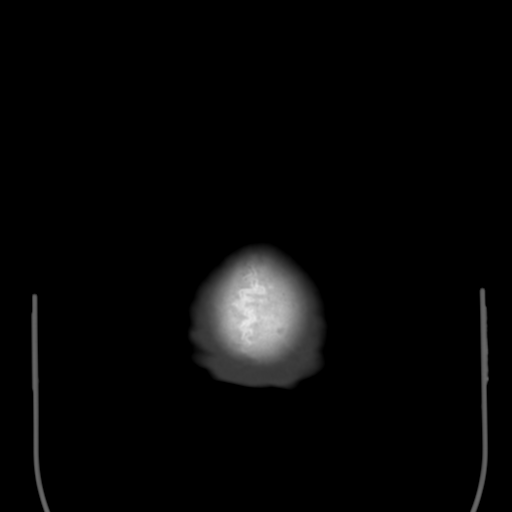

[Series 5: coronal soft tissue · coronal · 0.36mm/px · 3 of 84 slices shown]
[im 28/84  brain]
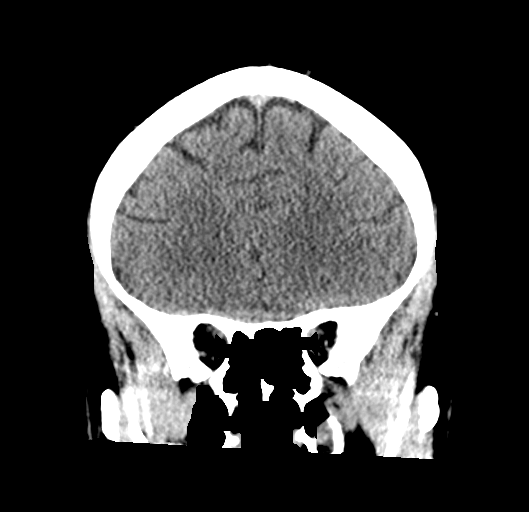
[im 37/84  brain]
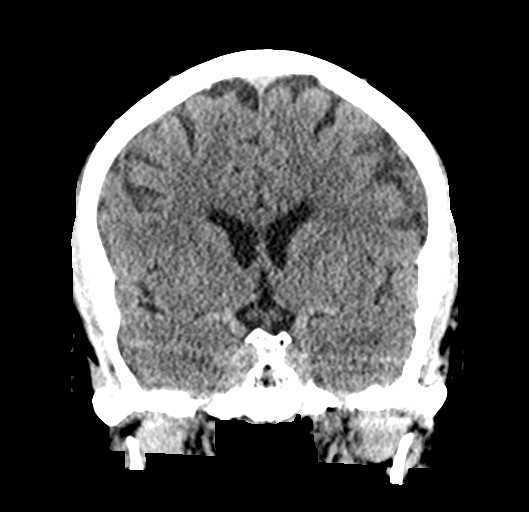
[im 47/84  brain]
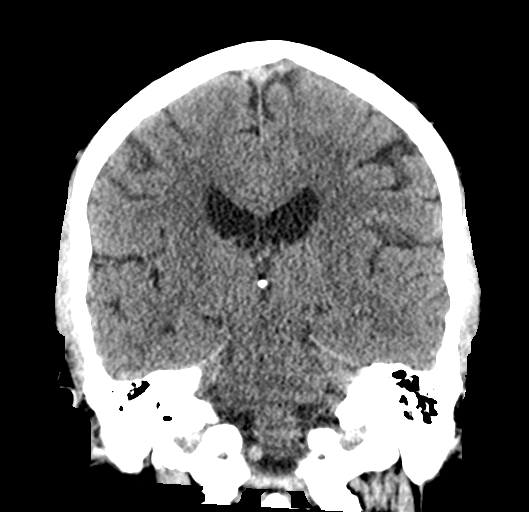

[Series 6: sagittal soft tissue · sagittal · 0.36mm/px · 3 of 65 slices shown]
[im 22/65  brain]
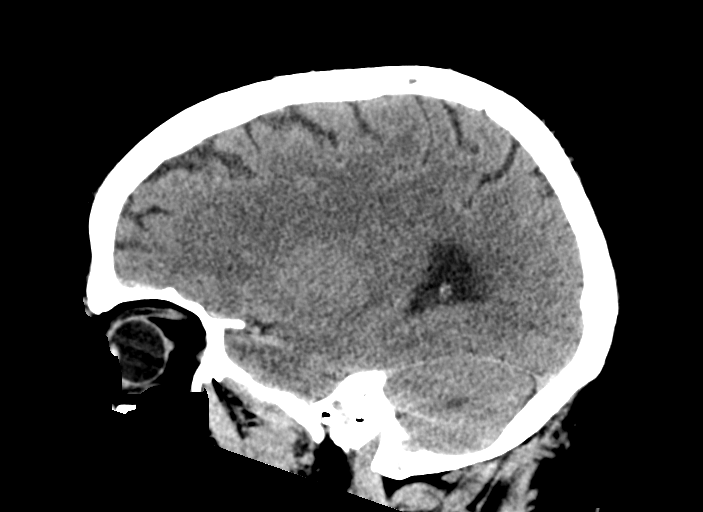
[im 33/65  brain]
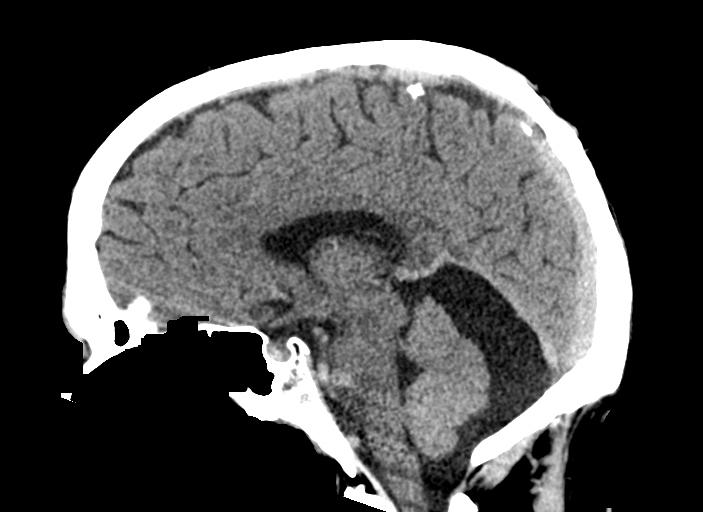
[im 43/65  brain]
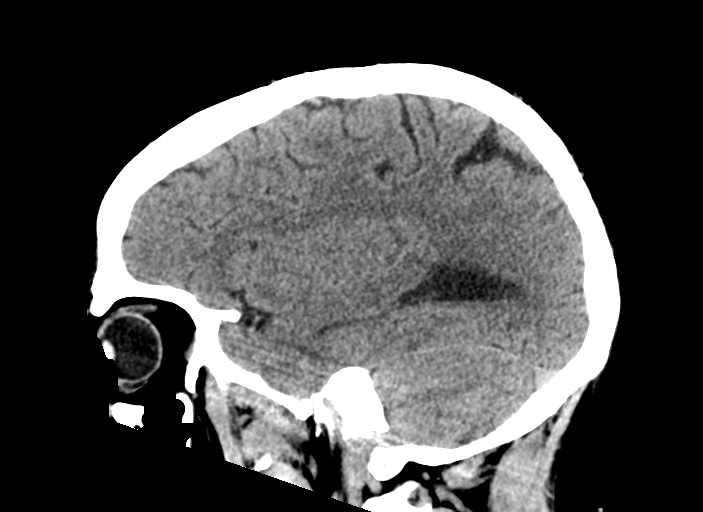

[15 of 47 positions shown; findings below may reference images not displayed]

FINDINGS: Brain:

Cerebral volume is normal.

There is no acute intracranial hemorrhage.

No demarcated cortical infarct.

No extra-axial fluid collection.

No evidence of intracranial mass.

No midline shift.

Midline retrocerebellar CSF density prominence measuring 3.5 x
cm in transaxial dimensions and 6.4 cm in craniocaudal dimension.
This may reflect a mega cisterna magna or posterior fossa arachnoid
cyst.

Vascular: No hyperdense vessel.

Skull: Normal. Negative for fracture or focal lesion.

Sinuses/Orbits: Visualized orbits show no acute finding.
Mild-to-moderate mucosal thickening within the imaged left maxillary
sinus.
IMPRESSION: No evidence of acute intracranial abnormality.

Retrocerebellar CSF density prominence, as described. This may
reflect a mega cisterna magna or posterior fossa arachnoid cyst.

Mild/moderate mucosal thickening within the imaged left maxillary
sinus.

## 2024-02-21 ENCOUNTER — Emergency Department (HOSPITAL_COMMUNITY)

## 2024-02-21 ENCOUNTER — Emergency Department (HOSPITAL_COMMUNITY)
Admission: EM | Admit: 2024-02-21 | Discharge: 2024-02-21 | Disposition: A | Discharge status: Home / Self Care | Attending: Emergency Medicine | Admitting: Emergency Medicine

## 2024-02-21 ENCOUNTER — Other Ambulatory Visit: Payer: Self-pay

## 2024-02-21 DIAGNOSIS — N181 Chronic kidney disease, stage 1: Secondary | ICD-10-CM | POA: Diagnosis not present

## 2024-02-21 DIAGNOSIS — R109 Unspecified abdominal pain: Secondary | ICD-10-CM | POA: Diagnosis present

## 2024-02-21 DIAGNOSIS — N132 Hydronephrosis with renal and ureteral calculous obstruction: Secondary | ICD-10-CM | POA: Insufficient documentation

## 2024-02-21 DIAGNOSIS — N200 Calculus of kidney: Secondary | ICD-10-CM

## 2024-02-21 LAB — CBC
HCT: 49.1 % (ref 39.0–52.0)
Hemoglobin: 15.6 g/dL (ref 13.0–17.0)
MCH: 28.9 pg (ref 26.0–34.0)
MCHC: 31.8 g/dL (ref 30.0–36.0)
MCV: 90.9 fL (ref 80.0–100.0)
Platelets: 239 10*3/uL (ref 150–400)
RBC: 5.4 MIL/uL (ref 4.22–5.81)
RDW: 13 % (ref 11.5–15.5)
WBC: 13.8 10*3/uL — ABNORMAL HIGH (ref 4.0–10.5)
nRBC: 0 % (ref 0.0–0.2)

## 2024-02-21 LAB — RESP PANEL BY RT-PCR (RSV, FLU A&B, COVID)  RVPGX2
Influenza A by PCR: NEGATIVE
Influenza B by PCR: NEGATIVE
Resp Syncytial Virus by PCR: NEGATIVE
SARS Coronavirus 2 by RT PCR: NEGATIVE

## 2024-02-21 LAB — URINALYSIS, ROUTINE W REFLEX MICROSCOPIC
Bacteria, UA: NONE SEEN
Bilirubin Urine: NEGATIVE
Glucose, UA: NEGATIVE mg/dL
Ketones, ur: 5 mg/dL — AB
Leukocytes,Ua: NEGATIVE
Nitrite: NEGATIVE
Protein, ur: NEGATIVE mg/dL
Specific Gravity, Urine: 1.016 (ref 1.005–1.030)
pH: 5 (ref 5.0–8.0)

## 2024-02-21 LAB — BASIC METABOLIC PANEL
Anion gap: 11 (ref 5–15)
BUN: 24 mg/dL — ABNORMAL HIGH (ref 6–20)
CO2: 22 mmol/L (ref 22–32)
Calcium: 9 mg/dL (ref 8.9–10.3)
Chloride: 103 mmol/L (ref 98–111)
Creatinine, Ser: 1.95 mg/dL — ABNORMAL HIGH (ref 0.61–1.24)
GFR, Estimated: 41 mL/min — ABNORMAL LOW (ref >60)
Glucose, Bld: 109 mg/dL — ABNORMAL HIGH (ref 70–99)
Potassium: 4.2 mmol/L (ref 3.5–5.1)
Sodium: 136 mmol/L (ref 135–145)

## 2024-02-21 MED ORDER — IOHEXOL 300 MG/ML  SOLN
80.0000 mL | Freq: Once | INTRAMUSCULAR | Status: AC | PRN
  Administered 2024-02-21: 80 mL via INTRAVENOUS

## 2024-02-21 MED ORDER — OXYCODONE-ACETAMINOPHEN 5-325 MG PO TABS: 1.0000 | ORAL_TABLET | Freq: Four times a day (QID) | ORAL | 0 refills | Status: DC | PRN

## 2024-02-21 MED ORDER — OXYCODONE HCL 5 MG PO TABS
5.0000 mg | ORAL_TABLET | Freq: Once | ORAL | Status: AC
  Administered 2024-02-21: 5 mg via ORAL
  Filled 2024-02-21: qty 1

## 2024-02-21 MED ORDER — ACETAMINOPHEN 500 MG PO TABS
1000.0000 mg | ORAL_TABLET | Freq: Once | ORAL | Status: AC
  Administered 2024-02-21: 1000 mg via ORAL
  Filled 2024-02-21: qty 2

## 2024-02-21 MED ORDER — TAMSULOSIN HCL 0.4 MG PO CAPS: 0.4000 mg | ORAL_CAPSULE | Freq: Every day | ORAL | 0 refills | Status: DC

## 2024-02-21 NOTE — Discharge Instructions (Addendum)
 You can take the pain medication as needed.  Avoid ibuprofen at this time.  Make sure you are drinking plenty of fluids.  If you start having a fever, worsening pain and vomiting return to the emergency room.  Otherwise follow-up with urology if the stone does not pass.

## 2024-02-21 NOTE — ED Provider Triage Note (Signed)
 Emergency Medicine Provider Triage Evaluation Note  Christian Pace , a 53 y.o. male  was evaluated in triage.  Pt complains of chief complaint of right flank pain. HX of NL Going on since Sunday Endorses fevers.  Review of Systems  Positive:  Negative:   Physical Exam  BP (!) 134/94 (BP Location: Right Arm)   Pulse (!) 113   Temp 98.8 F (37.1 C) (Oral)   Resp 18   Ht 6' (1.829 m)   Wt 96 kg   SpO2 96%   BMI 28.70 kg/m  Gen:   Awake, no distress   Resp:  Normal effort  MSK:   Moves extremities without difficulty  Other:    Medical Decision Making  Medically screening exam initiated at 9:04 AM.  Appropriate orders placed.  Christian Pace was informed that the remainder of the evaluation will be completed by another provider, this initial triage assessment does not replace that evaluation, and the importance of remaining in the ED until their evaluation is complete.     Glyn Ade, MD 02/21/24 [phone]

## 2024-02-21 NOTE — ED Provider Notes (Signed)
 [place] EMERGENCY DEPARTMENT AT [facility] Provider Note   CSN: [id number] Arrival date & time: 02/21/24  4098     History  Chief Complaint  Patient presents with   Abdominal Pain   Flank Pain    Christian Pace is a 53 y.o. male.  Patient is a 53 year old male with a history of CKD 1 who has had stents and lithotripsy in the past who is presenting today with abdominal pain that started on Sunday but became severe today and was unbearable.  He reports he feels it in the center of his abdomen and it radiates around to his right flank.  He reports it feels very similar to a kidney stone and when the pain initially started he did not worry about it but the pain became so severe today he could not take it.  Nausea without vomiting.  No known fevers.  Denies any difficulty urinating.  He reports being given a Percocet while he was in the lobby and states his pain is now a 4 out of 10 and much more bearable.  The history is provided by the patient.  Abdominal Pain Flank Pain Associated symptoms include abdominal pain.       Home Medications Prior to Admission medications   Medication Sig Start Date End Date Taking? Authorizing Provider  oxyCODONE-acetaminophen (PERCOCET/ROXICET) 5-325 MG tablet Take 1 tablet by mouth every 6 (six) hours as needed for severe pain (pain score 7-10). 02/21/24  Yes Gwyneth Sprout, MD  tamsulosin (FLOMAX) 0.4 MG CAPS capsule Take 1 capsule (0.4 mg total) by mouth daily after supper. 02/21/24  Yes Gwyneth Sprout, MD  ALPRAZolam Prudy Feeler) 0.25 MG tablet For MRI study- Take 1-2 tablets 30-45 minutes prior to MRI 1 additional tablet can be used right before MRI if needed. 05/25/21   Huston Foley, MD  amitriptyline (ELAVIL) 25 MG tablet Take 2 tablets (50 mg total) by mouth at bedtime. 05/20/21   Huston Foley, MD      Allergies    Fentanyl    Review of Systems   Review of Systems  Gastrointestinal:  Positive for abdominal pain.  Genitourinary:   Positive for flank pain.    Physical Exam Updated Vital Signs BP (!) 131/95 (BP Location: Right Arm)   Pulse 99   Temp 98.5 F (36.9 C) (Oral)   Resp 16   Ht 6' (1.829 m)   Wt 96 kg   SpO2 97%   BMI 28.70 kg/m  Physical Exam Vitals and nursing note reviewed.  Constitutional:      General: He is not in acute distress.    Appearance: He is well-developed.  HENT:     Head: Normocephalic and atraumatic.  Eyes:     Conjunctiva/sclera: Conjunctivae normal.     Pupils: Pupils are equal, round, and reactive to light.  Cardiovascular:     Rate and Rhythm: Normal rate and regular rhythm.     Heart sounds: No murmur heard. Pulmonary:     Effort: Pulmonary effort is normal. No respiratory distress.     Breath sounds: Normal breath sounds. No wheezing or rales.  Abdominal:     General: There is no distension.     Palpations: Abdomen is soft.     Tenderness: There is no abdominal tenderness. There is right CVA tenderness. There is no guarding or rebound.  Musculoskeletal:        General: No tenderness. Normal range of motion.     Cervical back: Normal range of motion  and neck supple.  Skin:    General: Skin is warm and dry.     Findings: No erythema or rash.  Neurological:     Mental Status: He is alert and oriented to person, place, and time.  Psychiatric:        Behavior: Behavior normal.     ED Results / Procedures / Treatments   Labs (all labs ordered are listed, but only abnormal results are displayed) Labs Reviewed  URINALYSIS, ROUTINE W REFLEX MICROSCOPIC - Abnormal; Notable for the following components:      Result Value   Hgb urine dipstick SMALL (*)    Ketones, ur 5 (*)    All other components within normal limits  BASIC METABOLIC PANEL - Abnormal; Notable for the following components:   Glucose, Bld 109 (*)    BUN 24 (*)    Creatinine, Ser 1.95 (*)    GFR, Estimated 41 (*)    All other components within normal limits  CBC - Abnormal; Notable for the  following components:   WBC 13.8 (*)    All other components within normal limits  RESP PANEL BY RT-PCR (RSV, FLU A&B, COVID)  RVPGX2    EKG None  Radiology CT ABDOMEN PELVIS W CONTRAST Result Date: 02/21/2024 CLINICAL DATA:  Acute abdominal pain worse in the right upper quadrant, hematuria, history of nephrolithiasis EXAM: CT ABDOMEN AND PELVIS WITH CONTRAST TECHNIQUE: Multidetector CT imaging of the abdomen and pelvis was performed using the standard protocol following bolus administration of intravenous contrast. RADIATION DOSE REDUCTION: This exam was performed according to the departmental dose-optimization program which includes automated exposure control, adjustment of the mA and/or kV according to patient size and/or use of iterative reconstruction technique. CONTRAST:  80mL OMNIPAQUE IOHEXOL 300 MG/ML  SOLN COMPARISON:  11/27/2015 FINDINGS: Lower chest: No acute abnormality. Hepatobiliary: Subcentimeter hepatic hypodensities in the right hepatic dome, image 9/2 and in the right inferior liver, image 25/2 both with low Hounsfield measurements consistent with small hepatic cysts measuring 11 mm or less. No other significant hepatic abnormality. Hepatic and portal veins are patent. No biliary dilatation 1 or obstruction. Gallbladder nondistended. Common bile duct nondilated. Pancreas: Unremarkable. No pancreatic ductal dilatation or surrounding inflammatory changes. Spleen: Normal in size without focal abnormality. Adrenals/Urinary Tract: Normal adrenal glands. There are numerous scattered radiopaque intrarenal calculi bilaterally, largest right intrarenal calculus measures 10 mm and largest left intrarenal calculus measures 13 mm. Right kidney demonstrates mild hydronephrosis secondary to an obstructing right UPJ calculus measuring 13 mm, image 44/2. No focal renal abnormality. Bladder unremarkable. Stomach/Bowel: Negative for bowel obstruction, significant dilatation, ileus, or free air. Terminal  ileum unremarkable. Normal appearing appendix. Majority of the colon redemonstrates chronic diffuse fatty proliferation of the wall, suspect sequelae from prior bouts of colitis or inflammatory bowel disease. There are a few scattered colonic diverticula. No current acute inflammatory process. No free fluid, fluid collection, hemorrhage, hematoma, abscess or ascites. Vascular/Lymphatic: No significant vascular findings are present. No enlarged abdominal or pelvic lymph nodes. Reproductive: No significant finding by CT Other: Bilateral inguinal hernia repairs. No recurrent inguinal hernia. Tiny fat containing umbilical hernia. No abdominopelvic ascites. Musculoskeletal: No acute or significant osseous findings. IMPRESSION: 1. 13 mm obstructing right UPJ calculus with mild right hydronephrosis. 2. Bilateral nephrolithiasis. 3. Chronic diffuse fatty proliferation of the colonic wall, nonspecific but suspect sequelae from prior bouts of colitis or inflammatory bowel disease. 4. Tiny fat containing umbilical hernia. 5. Bilateral inguinal hernia repairs. No recurrent inguinal hernia. Electronically Signed  By: Osvaldo Shipper M.D.   On: 02/21/2024 12:47    Procedures Procedures    Medications Ordered in ED Medications  oxyCODONE (Oxy IR/ROXICODONE) immediate release tablet 5 mg (5 mg Oral Given 02/21/24 0916)  acetaminophen (TYLENOL) tablet 1,000 mg (1,000 mg Oral Given 02/21/24 0917)  iohexol (OMNIPAQUE) 300 MG/ML solution 80 mL (80 mLs Intravenous Contrast Given 02/21/24 1023)    ED Course/ Medical Decision Making/ A&P                                 Medical Decision Making Amount and/or Complexity of Data Reviewed Labs: ordered. Decision-making details documented in ED Course. Radiology: ordered and independent interpretation performed. Decision-making details documented in ED Course.  Risk Prescription drug management.   Pt  presenting today with a complaint that caries a high risk for morbidity and  mortality. Pt with symptoms consistent with kidney stone.  Denies infectious sx, or GI symptoms.  Low concern for diverticulitis and no risk factors or history suggestive of AAA.  No hx suggestive of GU source (discharge) and otherwise pt is healthy but with multiple KS in the past with needed intervention such as stents and lithotripsy.  Patient received Percocet while he was in the lobby and reports his pain is manageable now to 4 out of 10.  I dependently interpreted patient's labs and UA today without signs of infection and small hemoglobin, BMP did show a creatinine of 1.95 with most recent one to compare in 2022 1.2, CBC with mild leukocytosis of 13.8 and respiratory viral panel was negative. I have independently visualized and interpreted pt's images today.  CT scan today showed a right-sided renal stone.  Radiology reports a 13 mm obstructing right UPJ calculus with mild right hydronephrosis and bilateral nephrolithiasis.  There is a chronic diffuse fatty proliferation of the colonic wall which is nonspecific as well as bilateral inguinal hernias that have been repaired and are nonrecurrent.  Findings discussed with the patient.  Currently he is pain controlled.  Will discharge home with Flomax and pain control as well as follow-up with urology.  Patient was given return precautions.         Final Clinical Impression(s) / ED Diagnoses Final diagnoses:  Kidney stone    Rx / DC Orders ED Discharge Orders          Ordered    oxyCODONE-acetaminophen (PERCOCET/ROXICET) 5-325 MG tablet  Every 6 hours PRN        02/21/24 1604    tamsulosin (FLOMAX) 0.4 MG CAPS capsule  Daily after supper        02/21/24 1604              Gwyneth Sprout, MD 02/21/24 1605

## 2024-02-21 NOTE — ED Triage Notes (Signed)
 Pt BIBA from home. C/o RUQ pain, and hematuria that radiates to the back for 4x days. Pt has hx of kidney stones and this feels similar.  AOx4

## 2024-02-22 ENCOUNTER — Other Ambulatory Visit: Payer: Self-pay | Admitting: Urology

## 2024-02-23 ENCOUNTER — Encounter (HOSPITAL_COMMUNITY): Payer: Self-pay | Admitting: Urology

## 2024-02-23 NOTE — Progress Notes (Signed)
 Spoke w/ via phone for pre-op interview---Christian Pace needs dos----KUB              COVID test -----patient states asymptomatic no test needed Arrive at -------0600 NPO after MN NO Solid Food.  Clear liquids from MN until--- Med rec completed. Pt aware to hold ASA/NSAIDs and supplements per [facility] protocol.  Medications to take morning of surgery -----percocet if needed Diabetic/Weight loss medication ----- No Alcohol or recreational drugs for 24 hours/Tobacco products for 6 hours ---- Patient instructed to bring blue lithotripsy folder, photo id and insurance card day of surgery. Patient aware to have Driver (ride ) / caregiver for 24 hours after surgery -----Christian Pace [phone] Patient Special Instructions -----laxative of choice Sunday, no pants with metal closures, no crocs, flip flops, or sandals. Pre-Op special Instructions ----- take laxative of choice day before procedure Patient verbalized understanding of instructions that were given at this phone interview. Patient denies shortness of breath, chest pain, fever, cough at this phone interview.

## 2024-02-26 ENCOUNTER — Ambulatory Visit (HOSPITAL_COMMUNITY)

## 2024-02-26 ENCOUNTER — Encounter (HOSPITAL_COMMUNITY): Payer: Self-pay | Admitting: Urology

## 2024-02-26 ENCOUNTER — Encounter (HOSPITAL_COMMUNITY)
Admission: RE | Disposition: A | Discharge status: Home / Self Care | Payer: Self-pay | Source: Home / Self Care | Attending: Urology

## 2024-02-26 ENCOUNTER — Ambulatory Visit (HOSPITAL_COMMUNITY)
Admission: RE | Admit: 2024-02-26 | Discharge: 2024-02-26 | Disposition: A | Discharge status: Home / Self Care | Attending: Urology | Admitting: Urology

## 2024-02-26 ENCOUNTER — Other Ambulatory Visit: Payer: Self-pay

## 2024-02-26 DIAGNOSIS — N201 Calculus of ureter: Secondary | ICD-10-CM | POA: Diagnosis present

## 2024-02-26 DIAGNOSIS — N202 Calculus of kidney with calculus of ureter: Secondary | ICD-10-CM | POA: Diagnosis not present

## 2024-02-26 HISTORY — PX: EXTRACORPOREAL SHOCK WAVE LITHOTRIPSY: SHX1557

## 2024-02-26 SURGERY — LITHOTRIPSY, ESWL
Anesthesia: LOCAL | Laterality: Right

## 2024-02-26 MED ORDER — DIPHENHYDRAMINE HCL 25 MG PO CAPS
25.0000 mg | ORAL_CAPSULE | ORAL | Status: AC
  Administered 2024-02-26: 25 mg via ORAL

## 2024-02-26 MED ORDER — ONDANSETRON 4 MG PO TBDP: 4.0000 mg | ORAL_TABLET | Freq: Three times a day (TID) | ORAL | 1 refills | Status: AC | PRN

## 2024-02-26 MED ORDER — SODIUM CHLORIDE 0.9 % IV SOLN: INTRAVENOUS | Status: DC

## 2024-02-26 MED ORDER — OXYCODONE-ACETAMINOPHEN 5-325 MG PO TABS: 1.0000 | ORAL_TABLET | Freq: Four times a day (QID) | ORAL | 0 refills | Status: AC | PRN

## 2024-02-26 MED ORDER — CIPROFLOXACIN HCL 500 MG PO TABS
500.0000 mg | ORAL_TABLET | ORAL | Status: AC
Start: 2024-02-26 — End: 2024-02-26
  Administered 2024-02-26: 500 mg via ORAL

## 2024-02-26 MED ORDER — TAMSULOSIN HCL 0.4 MG PO CAPS: 0.4000 mg | ORAL_CAPSULE | Freq: Every day | ORAL | 1 refills | Status: AC

## 2024-02-26 MED ORDER — DIPHENHYDRAMINE HCL 25 MG PO CAPS
ORAL_CAPSULE | ORAL | Status: AC
  Filled 2024-02-26: qty 1

## 2024-02-26 MED ORDER — DIAZEPAM 5 MG PO TABS
10.0000 mg | ORAL_TABLET | ORAL | Status: AC
  Administered 2024-02-26: 10 mg via ORAL

## 2024-02-26 MED ORDER — CIPROFLOXACIN HCL 500 MG PO TABS
ORAL_TABLET | ORAL | Status: AC
  Filled 2024-02-26: qty 1

## 2024-02-26 MED ORDER — DIAZEPAM 5 MG PO TABS
ORAL_TABLET | ORAL | Status: AC
  Filled 2024-02-26: qty 2

## 2024-02-26 NOTE — Op Note (Signed)
 See [employer] OP note scanned into chart. Also because of the size, density, location and other factors that cannot be anticipated I feel this will likely be a staged procedure. This fact supersedes any indication in the scanned [place] stone operative note to the contrary.

## 2024-02-26 NOTE — H&P (Signed)
 CC/HPI: New patient-    1) urolithiasis-patient developed right flank pain Sun 02/18/2024. Yesterday, pain pain increased and he went to ED. He underwent a CT scan 02/21/2024 which showed scattered stones in both kidneys. The largest was 9 mm on the right and 7 mm on the left. He also had a 13 mm right proximal stone. Stones were easily visible on the scout image. SSD 11 cm. WBC was 13.9, cr 1.95, UA clear. Pain settled down after po pain meds. No ketorolac. No fevers or chills. Staying hydrated. No emesis. No fever.   UA with many bacteria but not clinically infected. UA clear yesterday. Underwent a RIGHT PCNL in 2017 with Dr. Apolinar Junes.   Today. seen for the above.   He is in collections.       ALLERGIES: None   MEDICATIONS: Tamsulosin Hcl 0.4 mg capsule 1 capsule PO Daily  Oxycodone Hcl 1 PO Daily     GU PSH: None   NON-GU PSH: Hernia Repair - 2006 Remove Kidney Stone - about 2017     GU PMH: No GU PMH    NON-GU PMH: No Non-GU PMH    FAMILY HISTORY: No Family History    SOCIAL HISTORY: Marital Status: Single Current Smoking Status: Patient has never smoked.   Tobacco Use Assessment Completed: Used Tobacco in last 30 days? Drinks 1 drink per month. Social Drinker.  Drinks 3 caffeinated drinks per day.    REVIEW OF SYSTEMS:    GU Review Male:   BLOOD IN URINE.  Patient reports frequent urination, get up at night to urinate, and stream starts and stops. Patient denies hard to postpone urination, burning/ pain with urination, leakage of urine, trouble starting your stream, have to strain to urinate , erection problems, and penile pain.  Gastrointestinal (Upper):   Patient reports nausea. Patient denies vomiting and indigestion/ heartburn.  Gastrointestinal (Lower):   Patient denies diarrhea and constipation.  Constitutional:   Patient denies fever, night sweats, weight loss, and fatigue.  Skin:   Patient denies skin rash/ lesion and itching.  Eyes:   Patient denies blurred  vision and double vision.  Ears/ Nose/ Throat:   Patient denies sinus problems and sore throat.  Hematologic/Lymphatic:   Patient denies swollen glands and easy bruising.  Cardiovascular:   Patient denies leg swelling and chest pains.  Respiratory:   Patient reports cough. Patient denies shortness of breath.  Endocrine:   Patient denies excessive thirst.  Musculoskeletal:   Patient reports back pain. Patient denies joint pain.  Neurological:   Patient denies headaches and dizziness.  Psychologic:   Patient denies depression and anxiety.   VITAL SIGNS:      02/22/2024 10:47 AM  Weight 207 lb / 93.89 kg  Height 72 in / 182.88 cm  BP 142/92 mmHg  Pulse 109 /min  Temperature 97.8 F / 36.5 C  BMI 28.1 kg/m   MULTI-SYSTEM PHYSICAL EXAMINATION:    Constitutional: Well-nourished. No physical deformities. Normally developed. Good grooming.  Neck: Neck symmetrical, not swollen. Normal tracheal position.  Respiratory: No labored breathing, no use of accessory muscles.   Cardiovascular: Normal temperature, normal extremity pulses, no swelling, no varicosities.  Lymphatic: No enlargement of neck, axillae, groin.  Skin: No paleness, no jaundice, no cyanosis. No lesion, no ulcer, no rash.  Neurologic / Psychiatric: Oriented to time, oriented to place, oriented to person. No depression, no anxiety, no agitation.  Gastrointestinal: No mass, no tenderness, no rigidity, non obese abdomen.  Eyes: Normal conjunctivae. Normal  eyelids.  Ears, Nose, Mouth, and Throat: Left ear no scars, no lesions, no masses. Right ear no scars, no lesions, no masses. Nose no scars, no lesions, no masses. Normal hearing. Normal lips.  Musculoskeletal: Normal gait and station of head and neck.     Complexity of Data:  X-Ray Review: C.T. Abdomen/Pelvis: Reviewed Films. Discussed With Patient. 2025     PROCEDURES:          Visit Complexity - G2211 Chronic management         Urinalysis w/Scope - 81001 Dipstick  Dipstick Cont'd Micro  Color: Yellow Bilirubin: Neg mg/dL WBC/hpf: 0 - 5/hpf  Appearance: Clear Ketones: Trace mg/dL RBC/hpf: 3 - 16/XWR  Specific Gravity: 1.020 Blood: Trace ery/uL Bacteria: Many (>50/hpf)  pH: 5.5 Protein: 1+ mg/dL Cystals: NS (Not Seen)  Glucose: Neg mg/dL Urobilinogen: 0.2 mg/dL Casts: NS (Not Seen)    Nitrites: Neg Trichomonas: Not Present    Leukocyte Esterase: Neg leu/uL Mucous: Not Present      Epithelial Cells: 0 - 5/hpf      Yeast: NS (Not Seen)      Sperm: Not Present    ASSESSMENT:      ICD-10 Details  1 GU:   Renal calculus - N20.0 Undiagnosed New Problem - disc diet changes to prevent stones. will need work-up in future. Discussed surveillance of the renal stones. Possible treatment in the future.  2   Ureteral calculus - N20.1 Undiagnosed New Problem - I discussed with the patient and his wife the nature risks and benefits of continued stone passage, off label use of alpha blockers, shockwave lithotripsy or ureteroscopy. All questions answered. He will proceed with right proximal ureteral shockwave.   He has pain meds but may need a refill. Out of precaution I sent urine for cx and started abx.     PLAN:            Medications New Meds: Cephalexin 500 mg capsule 1 capsule PO BID   #14  0 Refill(s)  Pharmacy Name:  [id number]  Address:  [address]   [place], [place] [address]  Phone:  [phone]  Fax:  [phone]            Orders Labs Urine Culture          Schedule Return Visit/Planned Activity: ASAP - Schedule Surgery          Document Letter(s):  Created for Patient: Clinical Summary         Notes:   cc: [facility] [place]         Next Appointment:      Next Appointment: 02/26/2024 07:30 AM    Appointment Type: Surgery     Location: [facility] [phone]    Provider: Modena Slater, III, M.D.    Reason for Visit: OP--WL--RIGHT ESWL      Signed by Jerilee Field, M.D.  on 02/22/24 at 1:44 PM (EST

## 2024-02-26 NOTE — Interval H&P Note (Signed)
 History and Physical Interval Note:  02/26/2024 7:43 AM  Christian Pace  has presented today for surgery, with the diagnosis of RIGHT PROXIMAL STONE.  The various methods of treatment have been discussed with the patient and family. After consideration of risks, benefits and other options for treatment, the patient has consented to  Procedure(s): LITHOTRIPSY, ESWL (Right) as a surgical intervention.  The patient's history has been reviewed, patient examined, no change in status, stable for surgery.  I have reviewed the patient's chart and labs.  Questions were answered to the patient's satisfaction.     Ray Church, III

## 2024-02-26 NOTE — Discharge Instructions (Signed)

## 2024-02-27 ENCOUNTER — Encounter (HOSPITAL_COMMUNITY): Payer: Self-pay | Admitting: Urology

## 2024-03-25 ENCOUNTER — Other Ambulatory Visit: Payer: Self-pay | Admitting: Urology

## 2024-03-25 NOTE — Progress Notes (Signed)
 Surgery orders requested with Bjorn Loser at Dr. Estil Daft office.

## 2024-03-29 NOTE — Patient Instructions (Addendum)
 DUE TO COVID-19 ONLY TWO VISITORS  (aged 51 and older)  ARE ALLOWED TO COME WITH YOU AND STAY IN THE WAITING ROOM ONLY DURING PRE OP AND PROCEDURE.   **NO VISITORS ARE ALLOWED IN THE SHORT STAY AREA OR RECOVERY ROOM!!**  IF YOU WILL BE ADMITTED INTO THE HOSPITAL YOU ARE ALLOWED ONLY FOUR SUPPORT PEOPLE DURING VISITATION HOURS ONLY (7 AM -8PM)   The support person(s) must pass our screening, gel in and out, and wear a mask at all times, including in the patient's room. Patients must also wear a mask when staff or their support person are in the room. Visitors GUEST BADGE MUST BE WORN VISIBLY  One adult visitor may remain with you overnight and MUST be in the room by 8 P.M.     Your procedure is scheduled on: 04/09/24   Report to [facility] Main Entrance    Report to admitting at : 7:00 AM   Call this number if you have problems the morning of surgery [phone]   Do not eat food or drink: After Midnight.   FOLLOW ANY ADDITIONAL PRE OP INSTRUCTIONS YOU RECEIVED FROM YOUR SURGEON'S OFFICE!!!   Oral Hygiene is also important to reduce your risk of infection.                                    Remember - BRUSH YOUR TEETH THE MORNING OF SURGERY WITH YOUR REGULAR TOOTHPASTE  DENTURES WILL BE REMOVED PRIOR TO SURGERY PLEASE DO NOT APPLY "Poly grip" OR ADHESIVES!!!   Do NOT smoke after Midnight   Take these medicines the morning of surgery with A SIP OF WATER: Keflex                              You may not have any metal on your body including hair pins, jewelry, and body piercing             Do not wear lotions, powders, perfumes/cologne, or deodorant              Men may shave face and neck.   Do not bring valuables to the hospital. [facility] IS NOT             RESPONSIBLE   FOR VALUABLES.   Contacts, glasses, or bridgework may not be worn into surgery.   Bring small overnight bag day of surgery.   DO NOT BRING YOUR HOME MEDICATIONS TO THE HOSPITAL. PHARMACY WILL  DISPENSE MEDICATIONS LISTED ON YOUR MEDICATION LIST TO YOU DURING YOUR ADMISSION IN THE HOSPITAL!    Patients discharged on the day of surgery will not be allowed to drive home.  Someone NEEDS to stay with you for the first 24 hours after anesthesia.   Special Instructions: Bring a copy of your healthcare power of attorney and living will documents         the day of surgery if you haven't scanned them before.              Please read over the following fact sheets you were given: IF YOU HAVE QUESTIONS ABOUT YOUR PRE-OP INSTRUCTIONS PLEASE CALL [phone]    [facility] - Preparing for Surgery Before surgery, you can play an important role.  Because skin is not sterile, your skin needs to be as free of germs as possible.  You can  reduce the number of germs on your skin by washing with CHG (chlorahexidine gluconate) soap before surgery.  CHG is an antiseptic cleaner which kills germs and bonds with the skin to continue killing germs even after washing. Please DO NOT use if you have an allergy to CHG or antibacterial soaps.  If your skin becomes reddened/irritated stop using the CHG and inform your nurse when you arrive at Short Stay. Do not shave (including legs and underarms) for at least 48 hours prior to the first CHG shower.  You may shave your face/neck. Please follow these instructions carefully:  1.  Shower with CHG Soap the night before surgery and the  morning of Surgery.  2.  If you choose to wash your hair, wash your hair first as usual with your  normal  shampoo.  3.  After you shampoo, rinse your hair and body thoroughly to remove the  shampoo.                           4.  Use CHG as you would any other liquid soap.  You can apply chg directly  to the skin and wash                       Gently with a scrungie or clean washcloth.  5.  Apply the CHG Soap to your body ONLY FROM THE NECK DOWN.   Do not use on face/ open                           Wound or open sores. Avoid contact with  eyes, ears mouth and genitals (private parts).                       Wash face,  Genitals (private parts) with your normal soap.             6.  Wash thoroughly, paying special attention to the area where your surgery  will be performed.  7.  Thoroughly rinse your body with warm water from the neck down.  8.  DO NOT shower/wash with your normal soap after using and rinsing off  the CHG Soap.                9.  Pat yourself dry with a clean towel.            10.  Wear clean pajamas.            11.  Place clean sheets on your bed the night of your first shower and do not  sleep with pets. Day of Surgery : Do not apply any lotions/deodorants the morning of surgery.  Please wear clean clothes to the hospital/surgery center.  FAILURE TO FOLLOW THESE INSTRUCTIONS MAY RESULT IN THE CANCELLATION OF YOUR SURGERY PATIENT SIGNATURE_________________________________  NURSE SIGNATURE__________________________________  ________________________________________________________________________

## 2024-04-01 ENCOUNTER — Other Ambulatory Visit: Payer: Self-pay

## 2024-04-01 ENCOUNTER — Encounter (HOSPITAL_COMMUNITY)
Admission: RE | Admit: 2024-04-01 | Discharge: 2024-04-01 | Disposition: A | Discharge status: Home / Self Care | Source: Ambulatory Visit | Attending: Urology | Admitting: Urology

## 2024-04-01 ENCOUNTER — Encounter (HOSPITAL_COMMUNITY): Payer: Self-pay

## 2024-04-01 VITALS — BP 110/86 | HR 77 | Temp 98.4°F | Ht 72.0 in | Wt 206.0 lb

## 2024-04-01 DIAGNOSIS — Z01818 Encounter for other preprocedural examination: Secondary | ICD-10-CM

## 2024-04-01 DIAGNOSIS — Z01812 Encounter for preprocedural laboratory examination: Secondary | ICD-10-CM | POA: Insufficient documentation

## 2024-04-01 HISTORY — DX: Personal history of urinary calculi: Z87.442

## 2024-04-01 HISTORY — DX: Basal cell carcinoma of skin, unspecified: C44.91

## 2024-04-01 HISTORY — DX: Depression, unspecified: F32.A

## 2024-04-01 HISTORY — DX: Anxiety disorder, unspecified: F41.9

## 2024-04-01 LAB — CBC
HCT: 47.6 % (ref 39.0–52.0)
Hemoglobin: 15.2 g/dL (ref 13.0–17.0)
MCH: 29.5 pg (ref 26.0–34.0)
MCHC: 31.9 g/dL (ref 30.0–36.0)
MCV: 92.4 fL (ref 80.0–100.0)
Platelets: 216 10*3/uL (ref 150–400)
RBC: 5.15 MIL/uL (ref 4.22–5.81)
RDW: 13.2 % (ref 11.5–15.5)
WBC: 8.8 10*3/uL (ref 4.0–10.5)
nRBC: 0 % (ref 0.0–0.2)

## 2024-04-01 NOTE — Progress Notes (Signed)
 For Anesthesia: PCP - Center, [facility]  Cardiologist - N/A  Bowel Prep reminder:  Chest x-ray -  EKG -  Stress Test -  ECHO -  Cardiac Cath -  Pacemaker/ICD device last checked: Pacemaker orders received: Device Rep notified:  Spinal Cord Stimulator:N/A  Sleep Study - N/A CPAP -   Fasting Blood Sugar - N/A Checks Blood Sugar _____ times a day Date and result of last Hgb A1c-  Last dose of GLP1 agonist- N/A GLP1 instructions:   Last dose of SGLT-2 inhibitors- N/A SGLT-2 instructions:   Blood Thinner Instructions:N/A Aspirin Instructions: Last Dose:  Activity level: Can go up a flight of stairs and activities of daily living without stopping and without chest pain and/or shortness of breath   Able to exercise without chest pain and/or shortness of breath    Anesthesia review:   Patient denies shortness of breath, fever, cough and chest pain at PAT appointment   Patient verbalized understanding of instructions that were given to them at the PAT appointment. Patient was also instructed that they will need to review over the PAT instructions again at home before surgery.

## 2024-04-09 ENCOUNTER — Ambulatory Visit (HOSPITAL_COMMUNITY)

## 2024-04-09 ENCOUNTER — Ambulatory Visit (HOSPITAL_COMMUNITY)
Admission: RE | Admit: 2024-04-09 | Discharge: 2024-04-09 | Disposition: A | Discharge status: Home / Self Care | Attending: Urology | Admitting: Urology

## 2024-04-09 ENCOUNTER — Other Ambulatory Visit: Payer: Self-pay

## 2024-04-09 ENCOUNTER — Encounter (HOSPITAL_COMMUNITY): Payer: Self-pay | Admitting: Urology

## 2024-04-09 ENCOUNTER — Encounter (HOSPITAL_COMMUNITY)
Admission: RE | Disposition: A | Discharge status: Home / Self Care | Payer: Self-pay | Source: Home / Self Care | Attending: Urology

## 2024-04-09 DIAGNOSIS — F32A Depression, unspecified: Secondary | ICD-10-CM | POA: Insufficient documentation

## 2024-04-09 DIAGNOSIS — F419 Anxiety disorder, unspecified: Secondary | ICD-10-CM | POA: Diagnosis not present

## 2024-04-09 DIAGNOSIS — N202 Calculus of kidney with calculus of ureter: Secondary | ICD-10-CM | POA: Diagnosis not present

## 2024-04-09 DIAGNOSIS — Z79899 Other long term (current) drug therapy: Secondary | ICD-10-CM | POA: Insufficient documentation

## 2024-04-09 DIAGNOSIS — R519 Headache, unspecified: Secondary | ICD-10-CM | POA: Insufficient documentation

## 2024-04-09 DIAGNOSIS — N201 Calculus of ureter: Secondary | ICD-10-CM | POA: Diagnosis present

## 2024-04-09 DIAGNOSIS — N200 Calculus of kidney: Secondary | ICD-10-CM

## 2024-04-09 HISTORY — PX: CYSTOSCOPY/URETEROSCOPY/HOLMIUM LASER/STENT PLACEMENT: SHX6546

## 2024-04-09 SURGERY — CYSTOSCOPY/URETEROSCOPY/HOLMIUM LASER/STENT PLACEMENT
Anesthesia: General | Laterality: Right

## 2024-04-09 MED ORDER — MIDAZOLAM HCL 2 MG/2ML IJ SOLN
INTRAMUSCULAR | Status: AC
  Filled 2024-04-09: qty 2

## 2024-04-09 MED ORDER — EPHEDRINE SULFATE-NACL 50-0.9 MG/10ML-% IV SOSY
PREFILLED_SYRINGE | INTRAVENOUS | Status: DC | PRN
  Administered 2024-04-09 (×2): 2.5 mg via INTRAVENOUS
  Administered 2024-04-09: 5 mg via INTRAVENOUS
  Administered 2024-04-09: 2.5 mg via INTRAVENOUS
  Administered 2024-04-09 (×2): 5 mg via INTRAVENOUS

## 2024-04-09 MED ORDER — IOHEXOL 300 MG/ML  SOLN
INTRAMUSCULAR | Status: DC | PRN
Start: 2024-04-09 — End: 2024-04-09
  Administered 2024-04-09: 8 mL

## 2024-04-09 MED ORDER — CEFAZOLIN SODIUM-DEXTROSE 2-4 GM/100ML-% IV SOLN
2.0000 g | INTRAVENOUS | Status: AC
  Administered 2024-04-09: 2 g via INTRAVENOUS
  Filled 2024-04-09: qty 100

## 2024-04-09 MED ORDER — DEXAMETHASONE SODIUM PHOSPHATE 10 MG/ML IJ SOLN
INTRAMUSCULAR | Status: DC | PRN
  Administered 2024-04-09: 10 mg via INTRAVENOUS

## 2024-04-09 MED ORDER — ONDANSETRON HCL 4 MG/2ML IJ SOLN
INTRAMUSCULAR | Status: DC | PRN
  Administered 2024-04-09: 4 mg via INTRAVENOUS

## 2024-04-09 MED ORDER — ORAL CARE MOUTH RINSE: 15.0000 mL | Freq: Once | OROMUCOSAL | Status: AC

## 2024-04-09 MED ORDER — PHENYLEPHRINE 80 MCG/ML (10ML) SYRINGE FOR IV PUSH (FOR BLOOD PRESSURE SUPPORT)
PREFILLED_SYRINGE | INTRAVENOUS | Status: DC | PRN
  Administered 2024-04-09 (×2): 80 ug via INTRAVENOUS
  Administered 2024-04-09 (×2): 160 ug via INTRAVENOUS
  Administered 2024-04-09: 80 ug via INTRAVENOUS
  Administered 2024-04-09: 160 ug via INTRAVENOUS
  Administered 2024-04-09: 80 ug via INTRAVENOUS
  Administered 2024-04-09: 160 ug via INTRAVENOUS

## 2024-04-09 MED ORDER — LACTATED RINGERS IV SOLN: INTRAVENOUS | Status: DC

## 2024-04-09 MED ORDER — ACETAMINOPHEN 500 MG PO TABS
1000.0000 mg | ORAL_TABLET | Freq: Once | ORAL | Status: AC
  Administered 2024-04-09: 1000 mg via ORAL
  Filled 2024-04-09: qty 2

## 2024-04-09 MED ORDER — MIDAZOLAM HCL 5 MG/5ML IJ SOLN
INTRAMUSCULAR | Status: DC | PRN
  Administered 2024-04-09: 2 mg via INTRAVENOUS

## 2024-04-09 MED ORDER — FENTANYL CITRATE (PF) 100 MCG/2ML IJ SOLN
INTRAMUSCULAR | Status: AC
Start: 2024-04-09 — End: ?
  Filled 2024-04-09: qty 2

## 2024-04-09 MED ORDER — PROPOFOL 10 MG/ML IV BOLUS
INTRAVENOUS | Status: DC | PRN
Start: 2024-04-09 — End: 2024-04-09
  Administered 2024-04-09: 150 mg via INTRAVENOUS

## 2024-04-09 MED ORDER — KETOROLAC TROMETHAMINE 30 MG/ML IJ SOLN
INTRAMUSCULAR | Status: DC | PRN
  Administered 2024-04-09: 30 mg via INTRAVENOUS

## 2024-04-09 MED ORDER — LIDOCAINE HCL (PF) 2 % IJ SOLN
INTRAMUSCULAR | Status: AC
  Filled 2024-04-09: qty 5

## 2024-04-09 MED ORDER — FENTANYL CITRATE (PF) 100 MCG/2ML IJ SOLN
INTRAMUSCULAR | Status: DC | PRN
  Administered 2024-04-09: 50 ug via INTRAVENOUS
  Administered 2024-04-09 (×2): 25 ug via INTRAVENOUS

## 2024-04-09 MED ORDER — KETOROLAC TROMETHAMINE 30 MG/ML IJ SOLN
INTRAMUSCULAR | Status: AC
  Filled 2024-04-09: qty 1

## 2024-04-09 MED ORDER — LIDOCAINE HCL (CARDIAC) PF 100 MG/5ML IV SOSY
PREFILLED_SYRINGE | INTRAVENOUS | Status: DC | PRN
  Administered 2024-04-09: 100 mg via INTRAVENOUS

## 2024-04-09 MED ORDER — ONDANSETRON HCL 4 MG/2ML IJ SOLN
INTRAMUSCULAR | Status: AC
  Filled 2024-04-09: qty 2

## 2024-04-09 MED ORDER — SODIUM CHLORIDE 0.9 % IR SOLN
Status: DC | PRN
  Administered 2024-04-09: 3000 mL

## 2024-04-09 MED ORDER — DEXAMETHASONE SODIUM PHOSPHATE 10 MG/ML IJ SOLN
INTRAMUSCULAR | Status: AC
  Filled 2024-04-09: qty 1

## 2024-04-09 MED ORDER — PROPOFOL 10 MG/ML IV BOLUS
INTRAVENOUS | Status: AC
  Filled 2024-04-09: qty 20

## 2024-04-09 MED ORDER — CHLORHEXIDINE GLUCONATE 0.12 % MT SOLN
15.0000 mL | Freq: Once | OROMUCOSAL | Status: AC
  Administered 2024-04-09: 15 mL via OROMUCOSAL

## 2024-04-09 SURGICAL SUPPLY — 20 items
BAG URO CATCHER STRL LF (MISCELLANEOUS) ×1 IMPLANT
BASKET ZERO TIP NITINOL 2.4FR (BASKET) IMPLANT
CATH URETL OPEN END 6FR 70 (CATHETERS) ×1 IMPLANT
CLOTH BEACON ORANGE TIMEOUT ST (SAFETY) ×1 IMPLANT
EXTRACTOR STONE 1.7FRX115CM (UROLOGICAL SUPPLIES) IMPLANT
FIBER LASER MOSES 200 DFL (Laser) IMPLANT
FIBER LASER MOSES 365 DFL (Laser) IMPLANT
GLOVE BIO SURGEON STRL SZ7.5 (GLOVE) ×1 IMPLANT
GOWN STRL REUS W/ TWL XL LVL3 (GOWN DISPOSABLE) ×1 IMPLANT
GUIDEWIRE STR DUAL SENSOR (WIRE) ×1 IMPLANT
GUIDEWIRE ZIPWRE .038 STRAIGHT (WIRE) IMPLANT
KIT TURNOVER KIT A (KITS) IMPLANT
LASER FIB FLEXIVA PULSE ID 365 (Laser) IMPLANT
MANIFOLD NEPTUNE II (INSTRUMENTS) ×1 IMPLANT
PACK CYSTO (CUSTOM PROCEDURE TRAY) ×1 IMPLANT
SHEATH NAVIGATOR HD 11/13X28 (SHEATH) IMPLANT
SHEATH NAVIGATOR HD 11/13X36 (SHEATH) IMPLANT
STENT URET 6FRX28 CONTOUR (STENTS) IMPLANT
TUBING CONNECTING 10 (TUBING) ×1 IMPLANT
TUBING UROLOGY SET (TUBING) ×1 IMPLANT

## 2024-04-09 NOTE — Transfer of Care (Signed)
 Immediate Anesthesia Transfer of Care Note  Patient: Christian Pace  Procedure(s) Performed: CYSTOSCOPY/URETEROSCOPY/HOLMIUM LASER/RIGHT STENT PLACEMENT (Right)  Patient Location: PACU  Anesthesia Type:General  Level of Consciousness: awake, alert , and oriented  Airway & Oxygen Therapy: Patient Spontanous Breathing and Patient connected to face mask oxygen  Post-op Assessment: Report given to RN and Post -op Vital signs reviewed and stable  Post vital signs: Reviewed and stable  Last Vitals:  Vitals Value Taken Time  BP    Temp    Pulse 106 04/09/24 1141  Resp 15 04/09/24 1141  SpO2 99 % 04/09/24 1141  Vitals shown include unfiled device data.  Last Pain:  Vitals:   04/09/24 0703  TempSrc: Oral         Complications: No notable events documented.

## 2024-04-09 NOTE — Anesthesia Postprocedure Evaluation (Signed)
 Anesthesia Post Note  Patient: Christian Pace  Procedure(s) Performed: CYSTOSCOPY/URETEROSCOPY/HOLMIUM LASER/RIGHT STENT PLACEMENT (Right)     Patient location during evaluation: PACU Anesthesia Type: General Level of consciousness: awake and alert Pain management: pain level controlled Vital Signs Assessment: post-procedure vital signs reviewed and stable Respiratory status: spontaneous breathing, nonlabored ventilation, respiratory function stable and patient connected to nasal cannula oxygen Cardiovascular status: blood pressure returned to baseline and stable Postop Assessment: no apparent nausea or vomiting Anesthetic complications: no   No notable events documented.  Last Vitals:  Vitals:   04/09/24 1200 04/09/24 1215  BP: 120/72 108/64  Pulse: (!) 105 (!) 108  Resp: 19 14  Temp:    SpO2: 92% 92%    Last Pain:  Vitals:   04/09/24 1215  TempSrc:   PainSc: 0-No pain                 Leslye Rast

## 2024-04-09 NOTE — Discharge Instructions (Addendum)
 Ureteral stent-be sure to follow-up for ureteral stent removal with a quick look in your bladder on 04/16/2024. 8AM.

## 2024-04-09 NOTE — Anesthesia Procedure Notes (Signed)
 Procedure Name: LMA Insertion Date/Time: 04/09/2024 9:46 AM  Performed by: Bing Buff, RNPre-anesthesia Checklist: Patient identified, Emergency Drugs available, Suction available and Patient being monitored Patient Re-evaluated:Patient Re-evaluated prior to induction Oxygen Delivery Method: Circle system utilized Preoxygenation: Pre-oxygenation with 100% oxygen Induction Type: IV induction Ventilation: Mask ventilation without difficulty LMA: LMA inserted LMA Size: 4.0 Tube type: Oral Number of attempts: 1 Placement Confirmation: positive ETCO2 and breath sounds checked- equal and bilateral Tube secured with: Tape Dental Injury: Teeth and Oropharynx as per pre-operative assessment

## 2024-04-09 NOTE — Op Note (Signed)
 Preoperative diagnosis: Right renal and right ureteral stones  Postoperative diagnosis: Same  Procedure: Cystoscopy with right retrograde pyelogram, right ureteroscopy laser lithotripsy right ureteral stent placement  Surgeon: Derrick Fling  Anesthesia: General  Indication for procedure: Christian Pace is a 53 year old male presented with a 15 mm right proximal/UPJ stone and other stones in his kidney.  He underwent right shockwave over the UPJ stone and had residual fragments in the ureter in the kidney.  Findings: On exam the penis is circumcised without mass or lesion.  The glans and meatus appeared normal.  The scrotum appeared normal.  On cystoscopy he had quite a high bladder neck but no stone or foreign body in the bladder and no mucosal lesions.  Fluoroscopy revealed fragments lined up in the distal ureter.    Right retrograde pyelogram-this outlined a single ureter single collecting system unit with multiple filling defects in the distal ureter consistent with stone fragments but no upstream hydronephrosis.  Description of procedure: After consent was obtained patient brought to the operating room.  After adequate anesthesia he is placed in lithotomy position and prepped and draped in the usual sterile fashion.  Timeout was performed to confirm the patient and procedure.  The cystoscope was passed per urethra and the bladder inspected.  The right ureteral orifice was cannulated with the 5 [place] open-ended catheter and retrograde injection of contrast was performed.  A sensor wire was then advanced and coiled in an upper calyx.  The dual channel semirigid ureteroscope was then advanced where stones were noted at the ureteral orifice and I inserted a basket and pulled a few fragments out dropped those in the bladder.  We then went into the distal ureter and fragmented several other fragments with a 365 laser fiber.  This fragments were then sequentially grasped with a 0 tip basket and dropped in the  bladder.  Follow-up ureteroscopy up into the mid ureter noted there to be no other stones.  On fluoroscopy no stones were noted in the proximal ureter but stones in the kidney.  Therefore I passed a zip wire under direct vision and backed out the semirigid scope.  I should also add during the ureteroscopic phase I slid the cystoscope sheath into the bladder several times to drain his bladder.  I passed a medium access sheath without any difficulty.  We then passed a dual channel digital ureteroscope where several stones were noted in the upper middle and lower pole.  Stones were very dense.  I grasped a few small fragments and pulled those out through the sheath.  I then took the 365 and dusted most of the stones.  There was still some fragments in the mid to lower pole calyx but these were all small and should pass.  I did try to remove them with a 0 tip basket and an engage basket but the calyx was a bit inferior and posterior and made it difficult to angle.  We were able to grab 1 larger stone in this calyx and drop it in the upper pole and dusted.  No other significant stone fragments were noted and no other stones to access ureteroscopically, the access sheath was backed out on the ureteroscope the collecting system renal pelvis ureter inspected on the way out noted to have no other stone fragment and no injury.  The wire was backloaded on the cystoscope and a 628 cm stent advanced.  The wire was removed with good coil seen up in the kidney and a good coil in  the bladder.  The bladder was drained and the scope removed.  He was awakened and taken to the cover room in stable condition.  Complications: None  Blood loss: Minimal  Specimens: None  Drains: 6 x 28 cm right ureteral stent  Disposition: Patient stable to PACU.  At his request I gave him the stone fragments we collected.

## 2024-04-09 NOTE — Anesthesia Preprocedure Evaluation (Addendum)
 Anesthesia Evaluation  Patient identified by MRN, date of birth, ID band Patient awake    Reviewed: Allergy & Precautions, NPO status , Patient's Chart, lab work & pertinent test results  History of Anesthesia Complications Negative for: history of anesthetic complications  Airway Mallampati: II  TM Distance: >3 FB Neck ROM: Full    Dental  (+) Missing,    Pulmonary neg pulmonary ROS   Pulmonary exam normal        Cardiovascular negative cardio ROS Normal cardiovascular exam     Neuro/Psych  Headaches  Anxiety Depression       GI/Hepatic negative GI ROS, Neg liver ROS,,,  Endo/Other  negative endocrine ROS    Renal/GU RIGHT URETERAL STONES  AND RIGHT RENAL STONES  negative genitourinary   Musculoskeletal negative musculoskeletal ROS (+)    Abdominal   Peds  Hematology negative hematology ROS (+)   Anesthesia Other Findings Day of surgery medications reviewed with patient.  Reproductive/Obstetrics negative OB ROS                              Anesthesia Physical Anesthesia Plan  ASA: 1  Anesthesia Plan: General   Post-op Pain Management: Tylenol  PO (pre-op)*   Induction: Intravenous  PONV Risk Score and Plan: 2 and Treatment may vary due to age or medical condition, Ondansetron , Dexamethasone  and Midazolam   Airway Management Planned: LMA  Additional Equipment: None  Intra-op Plan:   Post-operative Plan: Extubation in OR  Informed Consent: I have reviewed the patients History and Physical, chart, labs and discussed the procedure including the risks, benefits and alternatives for the proposed anesthesia with the patient or authorized representative who has indicated his/her understanding and acceptance.     Dental advisory given  Plan Discussed with: CRNA  Anesthesia Plan Comments:         Anesthesia Quick Evaluation

## 2024-04-09 NOTE — H&P (Signed)
 H&P  Chief Complaint: Right renal stones  History of Present Illness: Christian Pace is a 53 year old male with a history of stones.  He presented in March 2025 with a 15 mm right proximal/right renal pelvic stone.  He also had about 5 other stones in his right kidney.  He underwent right shockwave lithotripsy in March 2025.  He passed multiple fragments but when he followed up he had 2 proximal 8 mm fragments and would like another fragment of dropped into his right lower pole.  He also had the remaining right stones which were 5 to 9 mm.  After careful discussion he elected to proceed with right ureteroscopy.  He reports to me since the office visit he has not seen any stone pass.  He has had no flank pain.  No fever.  No bladder pain or gross hematuria.  No cough cold or congestion.  Past Medical History:  Diagnosis Date   Anxiety    Cancer (HCC)    skin   Cancer of the skin, basal cell    left ear   Depression    Headache    History of kidney stones    Kidney stones    Renal disorder    Past Surgical History:  Procedure Laterality Date   CYSTOSCOPY W/ URETERAL STENT PLACEMENT Bilateral 01/04/2016   Procedure: CYSTOSCOPY WITH STENT REPLACEMENT;  Surgeon: Dustin Gimenez, MD;  Location: [facility];  Service: Urology;  Laterality: Bilateral;   CYSTOSCOPY WITH STENT PLACEMENT Bilateral 12/02/2015   Procedure: CYSTOSCOPY WITH STENT PLACEMENT,bilateral retrograde pylorograms;  Surgeon: Dustin Gimenez, MD;  Location: [facility];  Service: Urology;  Laterality: Bilateral;   EXTRACORPOREAL SHOCK WAVE LITHOTRIPSY Right 02/26/2024   Procedure: LITHOTRIPSY, ESWL;  Surgeon: Samson Croak, MD;  Location: [facility];  Service: Urology;  Laterality: Right;   HERNIA REPAIR     NEPHROLITHOTOMY N/A 02/29/2016   Procedure: NEPHROLITHOTOMY PERCUTANEOUS cystoscopy right stent removal;  Surgeon: Dustin Gimenez, MD;  Location: [facility];  Service: Urology;  Laterality: N/A;   URETEROSCOPY WITH HOLMIUM LASER LITHOTRIPSY Left  12/02/2015   Procedure: URETEROSCOPY WITH HOLMIUM LASER LITHOTRIPSY WITH RETROGRADE PYLOGRAM;  Surgeon: Dustin Gimenez, MD;  Location: [facility];  Service: Urology;  Laterality: Left;   URETEROSCOPY WITH HOLMIUM LASER LITHOTRIPSY Left 01/04/2016   Procedure: URETEROSCOPY WITH HOLMIUM LASER LITHOTRIPSY;  Surgeon: Dustin Gimenez, MD;  Location: [facility];  Service: Urology;  Laterality: Left;    Home Medications:  Medications Prior to Admission  Medication Sig Dispense Refill Last Dose/Taking   ondansetron  (ZOFRAN -ODT) 4 MG disintegrating tablet Take 1 tablet (4 mg total) by mouth every 8 (eight) hours as needed for nausea or vomiting. 20 tablet 1 Taking As Needed   oxyCODONE -acetaminophen  (PERCOCET/ROXICET) 5-325 MG tablet Take 1 tablet by mouth every 6 (six) hours as needed for severe pain (pain score 7-10). 15 tablet 0 Taking As Needed   tamsulosin  (FLOMAX ) 0.4 MG CAPS capsule Take 1 capsule (0.4 mg total) by mouth daily after supper. 15 capsule 1 04/08/2024   Allergies:  Allergies  Allergen Reactions   Fentanyl  Other (See Comments)    Change in mentation for long period of time    Family History  Problem Relation Age of Onset   Healthy Mother    Healthy Father    Neuropathy Father    Multiple sclerosis Sister    Colitis Brother    Other Brother        poss mini stroke   Social History:  reports that he has  never smoked. He has never used smokeless tobacco. He reports current alcohol use. He reports that he does not use drugs.  ROS: A complete review of systems was performed.  All systems are negative except for pertinent findings as noted. Review of Systems  All other systems reviewed and are negative.    Physical Exam:  Vital signs in last 24 hours: Temp:  [98.1 F (36.7 C)] 98.1 F (36.7 C) (04/22 0703) Pulse Rate:  [102] 102 (04/22 0703) Resp:  [16] 16 (04/22 0703) BP: (147)/(89) 147/89 (04/22 0703) SpO2:  [96 %] 96 % (04/22 0703) Weight:  [93.4 kg] 93.4 kg (04/22  0711) General:  Alert and oriented, No acute distress HEENT: Normocephalic, atraumatic Cardiovascular: Regular rate and rhythm Lungs: Regular rate and effort Abdomen: Soft, nontender, nondistended, no abdominal masses Back: No CVA tenderness Extremities: No edema Neurologic: Grossly intact  Laboratory Data:  No results found for this or any previous visit (from the past 24 hours). No results found for this or any previous visit (from the past 240 hours). Creatinine: No results for input(s): "CREATININE" in the last 168 hours.  Impression/Assessment:  Right renal stones -   Plan:  I discussed with the patient the nature, potential benefits, risks and alternatives to cystoscopy with right ureteroscopy, laser lithotripsy right ureteral stent placement, including side effects of the proposed treatment, the likelihood of the patient achieving the goals of the procedure, and any potential problems that might occur during the procedure or recuperation.  We discussed he may have passed the fragments in the right ureter and we could consider canceling today and follow-up in the office with another x-ray and ultrasound.  Again he reports he has not seen any other stones pass since the office visit and would like to address the stones in the right kidney.  We also discussed he may need a staged procedure.  All questions answered. Patient elects to proceed.   Christina Coyer 04/09/2024, 9:28 AM

## 2024-04-10 ENCOUNTER — Encounter (HOSPITAL_COMMUNITY): Payer: Self-pay | Admitting: Urology
